# Patient Record
Sex: Female | Born: 1965
Health system: Southern US, Community
[De-identification: ages and names within clinical notes are randomized; demographics above are authoritative.]

## PROBLEM LIST (undated history)

## (undated) DIAGNOSIS — N200 Calculus of kidney: Secondary | ICD-10-CM

## (undated) DIAGNOSIS — G43909 Migraine, unspecified, not intractable, without status migrainosus: Secondary | ICD-10-CM

## (undated) DIAGNOSIS — F329 Major depressive disorder, single episode, unspecified: Secondary | ICD-10-CM

## (undated) DIAGNOSIS — F32A Depression, unspecified: Secondary | ICD-10-CM

## (undated) DIAGNOSIS — I10 Essential (primary) hypertension: Secondary | ICD-10-CM

## (undated) HISTORY — PX: WISDOM TOOTH EXTRACTION: SHX21

## (undated) HISTORY — PX: URETERAL STENT PLACEMENT: SHX822

## (undated) HISTORY — PX: LITHOTRIPSY: SUR834

---

## 2001-01-31 ENCOUNTER — Ambulatory Visit (HOSPITAL_COMMUNITY): Admission: RE | Admit: 2001-01-31 | Discharge: 2001-01-31 | Payer: Self-pay | Admitting: *Deleted

## 2001-01-31 ENCOUNTER — Encounter: Payer: Self-pay | Admitting: Orthopedic Surgery

## 2001-02-07 ENCOUNTER — Encounter: Payer: Self-pay | Admitting: Orthopedic Surgery

## 2001-02-07 ENCOUNTER — Ambulatory Visit (HOSPITAL_COMMUNITY): Admission: RE | Admit: 2001-02-07 | Discharge: 2001-02-07 | Payer: Self-pay | Admitting: Orthopedic Surgery

## 2001-05-31 ENCOUNTER — Other Ambulatory Visit: Admission: RE | Admit: 2001-05-31 | Discharge: 2001-05-31 | Payer: Self-pay | Admitting: Family Medicine

## 2001-08-14 ENCOUNTER — Encounter: Admission: RE | Admit: 2001-08-14 | Discharge: 2001-08-14 | Payer: Self-pay | Admitting: Family Medicine

## 2001-08-14 ENCOUNTER — Encounter: Payer: Self-pay | Admitting: Family Medicine

## 2002-04-24 ENCOUNTER — Encounter: Admission: RE | Admit: 2002-04-24 | Discharge: 2002-04-24 | Payer: Self-pay | Admitting: Family Medicine

## 2002-04-24 ENCOUNTER — Encounter: Payer: Self-pay | Admitting: Family Medicine

## 2003-04-18 ENCOUNTER — Other Ambulatory Visit: Admission: RE | Admit: 2003-04-18 | Discharge: 2003-04-18 | Payer: Self-pay | Admitting: Obstetrics and Gynecology

## 2003-10-13 ENCOUNTER — Emergency Department (HOSPITAL_COMMUNITY): Admission: EM | Admit: 2003-10-13 | Discharge: 2003-10-13 | Payer: Self-pay | Admitting: Emergency Medicine

## 2003-10-16 ENCOUNTER — Encounter: Admission: RE | Admit: 2003-10-16 | Discharge: 2003-10-16 | Payer: Self-pay | Admitting: Family Medicine

## 2003-11-12 ENCOUNTER — Encounter: Admission: RE | Admit: 2003-11-12 | Discharge: 2003-11-12 | Payer: Self-pay | Admitting: Family Medicine

## 2004-05-11 ENCOUNTER — Other Ambulatory Visit: Admission: RE | Admit: 2004-05-11 | Discharge: 2004-05-11 | Payer: Self-pay | Admitting: Obstetrics and Gynecology

## 2005-05-13 ENCOUNTER — Other Ambulatory Visit: Admission: RE | Admit: 2005-05-13 | Discharge: 2005-05-13 | Payer: Self-pay | Admitting: Obstetrics and Gynecology

## 2005-05-23 ENCOUNTER — Ambulatory Visit (HOSPITAL_COMMUNITY): Admission: RE | Admit: 2005-05-23 | Discharge: 2005-05-24 | Payer: Self-pay | Admitting: Urology

## 2005-05-23 ENCOUNTER — Encounter: Payer: Self-pay | Admitting: Emergency Medicine

## 2005-06-10 ENCOUNTER — Ambulatory Visit (HOSPITAL_COMMUNITY): Admission: RE | Admit: 2005-06-10 | Discharge: 2005-06-10 | Payer: Self-pay | Admitting: Urology

## 2006-01-22 IMAGING — CT CT ANGIO CHEST
1 of 4 series · 19 of 30 positions shown · IV contrast (omnipaque)
Comparison: none

CLINICAL DATA: Chest pain, dyspnea.  
 CHEST CT ANGIO WITH CONTRAST
TECHNIQUE: Multidetector CT imaging of the chest was performed according to the protocol for detection of pulmonary embolism during IV bolus injection of 150 ml Omnipaque 300.  Coronal and sagittal plane reformatted images were also generated.

[Series 4: chest/pe 1.0 b10f · axial · 0.74mm/px · z∈[-47,+156]mm · 19 of 448 slices shown]
[im 21/448  lung]
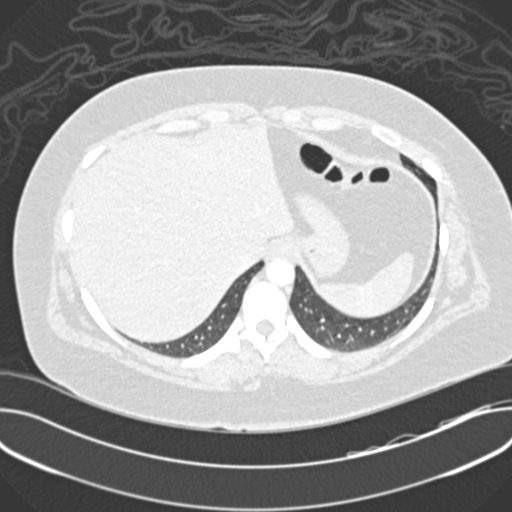
[im 41/448  mediastinal]
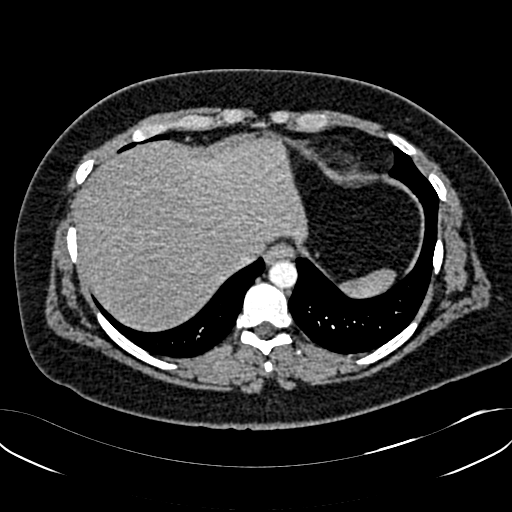
[im 61/448  lung]
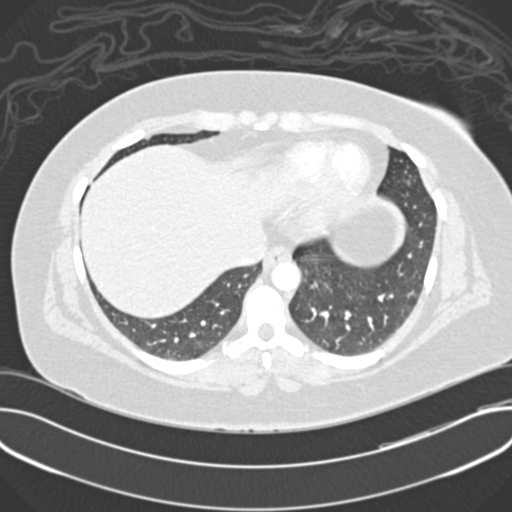
[im 82/448  mediastinal]
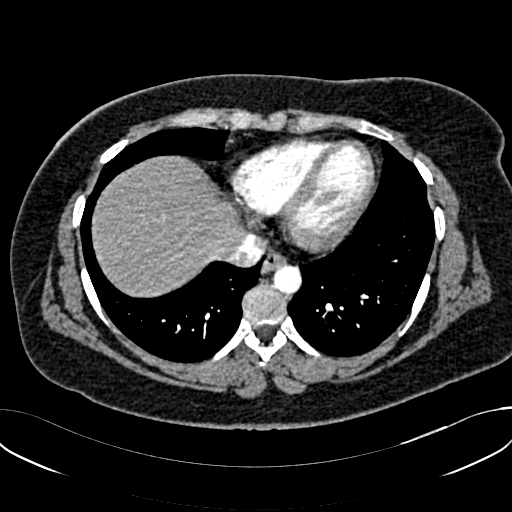
[im 102/448  lung]
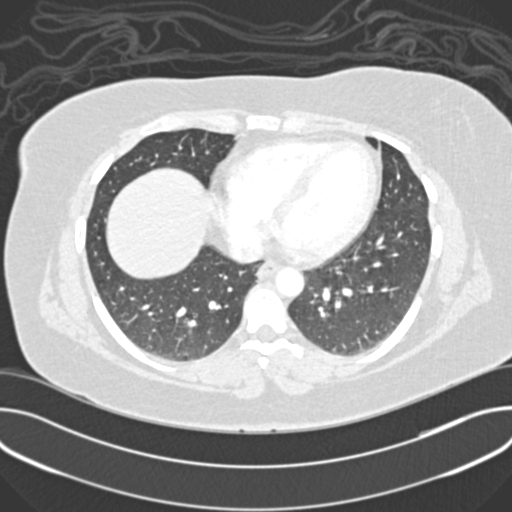
[im 122/448  mediastinal]
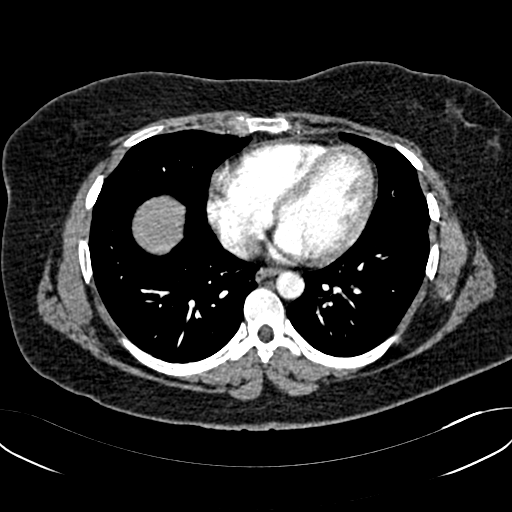
[im 143/448  lung]
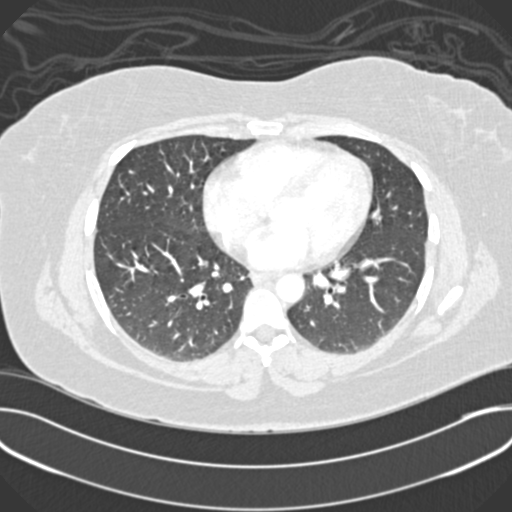
[im 163/448  mediastinal]
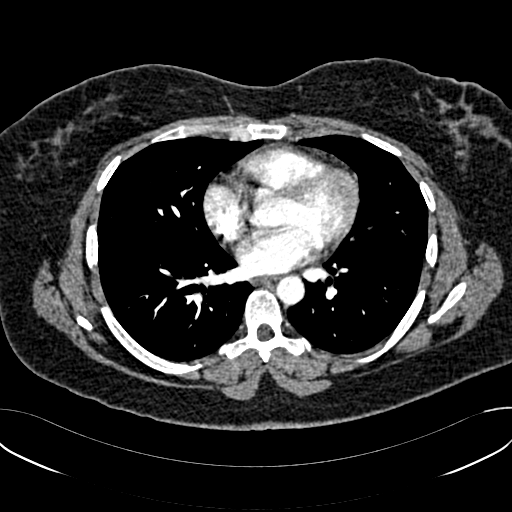
[im 183/448  lung]
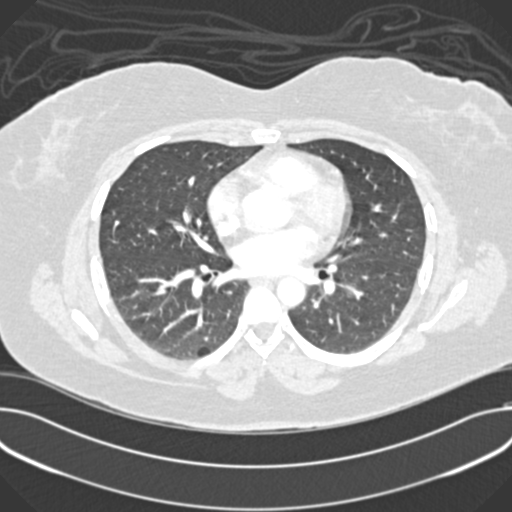
[im 224/448  mediastinal]
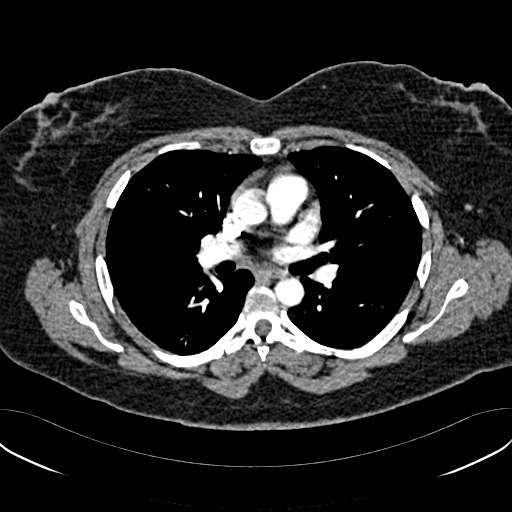
[im 244/448  lung]
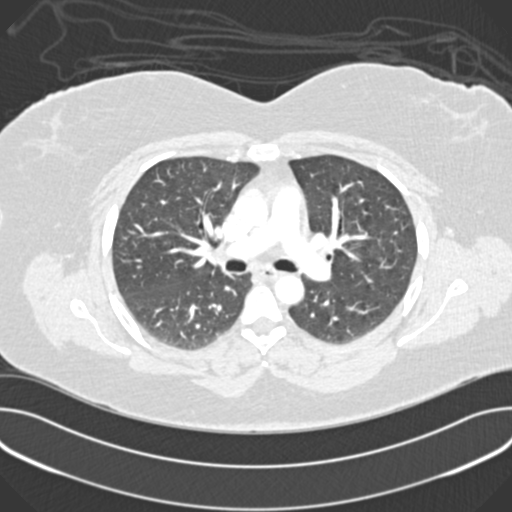
[im 265/448  mediastinal]
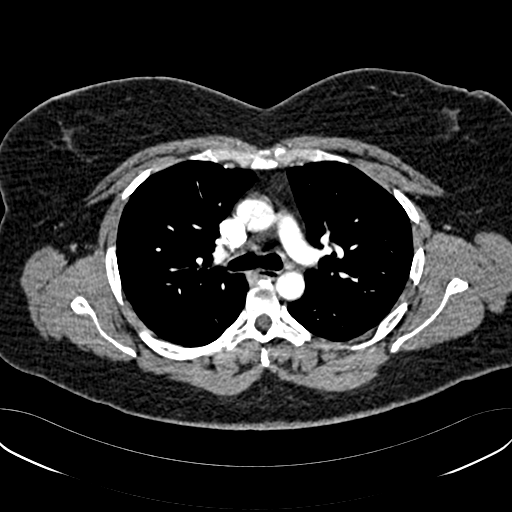
[im 285/448  lung]
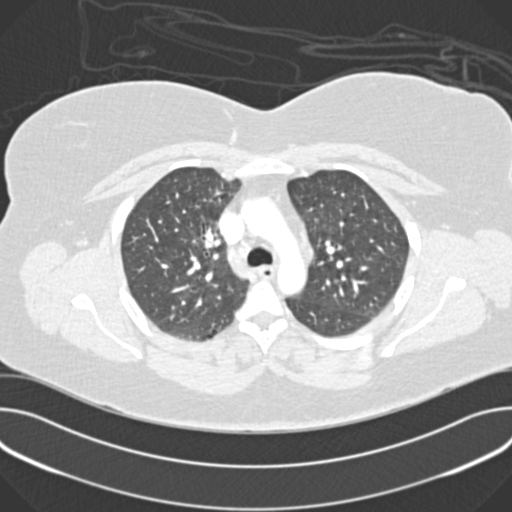
[im 305/448  mediastinal]
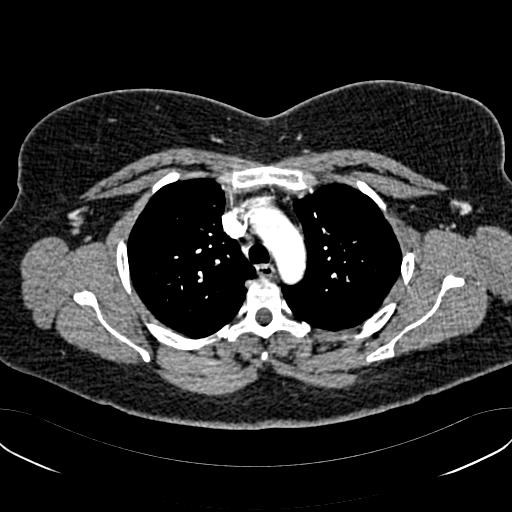
[im 326/448  lung]
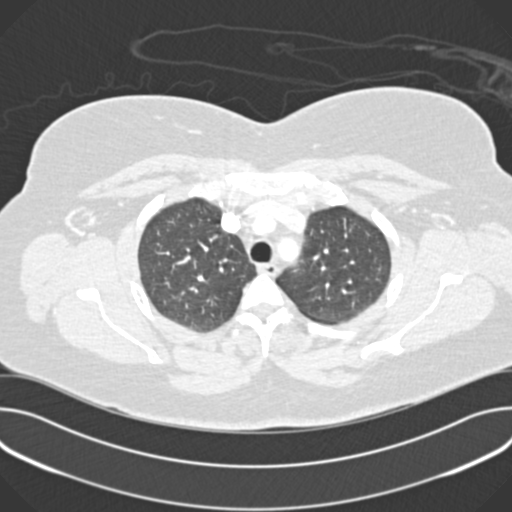
[im 346/448  mediastinal]
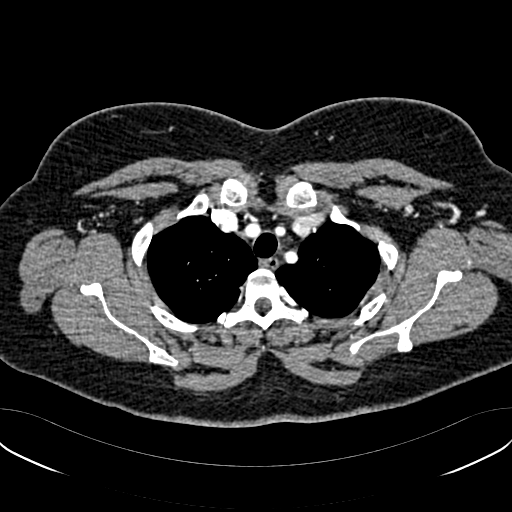
[im 387/448  lung]
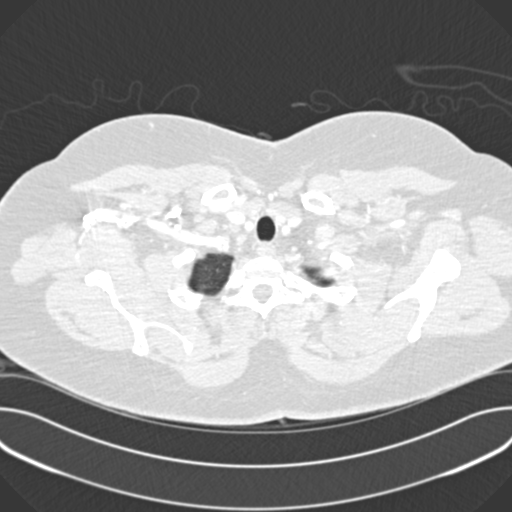
[im 407/448  mediastinal]
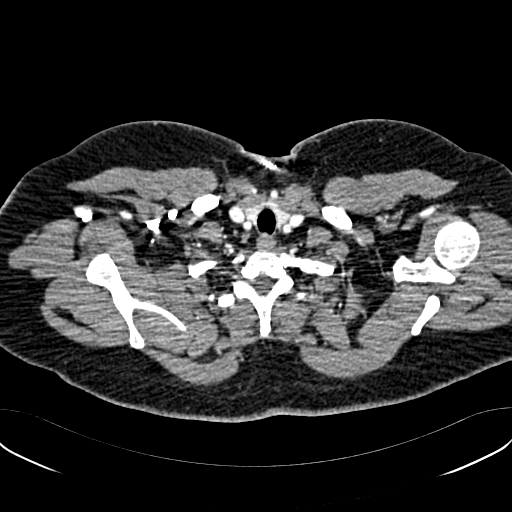
[im 427/448  lung]
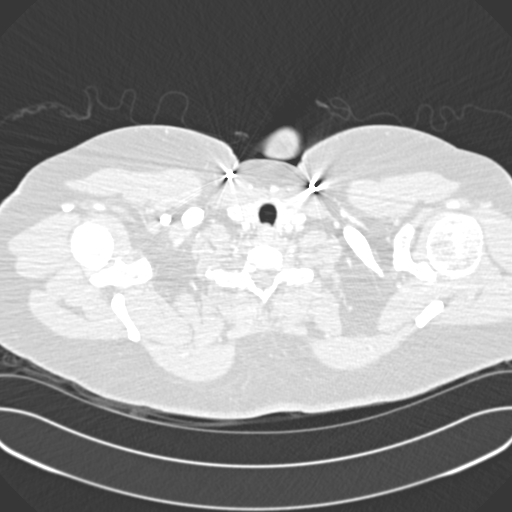

[19 of 30 positions shown; findings below may reference images not displayed]

FINDINGS: No comparison films available. 
 No filling defects in the pulmonary arterial system are noted to suggest pulmonary embolus.  Heart and great vessels are grossly unremarkable.  No pleural or pericardial effusions.  No enlarged lymph nodes identified.  Minimal scar/atelectasis in the left anterior lung base noted.  The remainder of the lungs are clear.  
 IMPRESSION
 No acute abnormality.  Specifically, no evidence of pulmonary embolus.

## 2009-01-28 ENCOUNTER — Ambulatory Visit: Payer: Self-pay | Admitting: Diagnostic Radiology

## 2009-01-28 ENCOUNTER — Ambulatory Visit (HOSPITAL_BASED_OUTPATIENT_CLINIC_OR_DEPARTMENT_OTHER): Admission: RE | Admit: 2009-01-28 | Discharge: 2009-01-28 | Payer: Self-pay | Admitting: Family Medicine

## 2010-04-10 ENCOUNTER — Ambulatory Visit (HOSPITAL_BASED_OUTPATIENT_CLINIC_OR_DEPARTMENT_OTHER)
Admission: RE | Admit: 2010-04-10 | Discharge: 2010-04-10 | Payer: Self-pay | Source: Home / Self Care | Attending: Family Medicine | Admitting: Family Medicine

## 2010-05-17 ENCOUNTER — Encounter: Payer: Self-pay | Admitting: Family Medicine

## 2010-09-11 NOTE — Op Note (Signed)
NAME:  Heather Gomez, Heather Gomez          ACCOUNT NO.:  [id number]   MEDICAL RECORD NO.:  [id number]          PATIENT TYPE:  INP   LOCATION:  1504                         FACILITY:  [facility]   PHYSICIAN:  Courtney Paris, M.D.DATE OF BIRTH:  1965/11/20   DATE OF PROCEDURE:  05/24/2005  DATE OF DISCHARGE:                                 OPERATIVE REPORT   PREOPERATIVE DIAGNOSIS:  Bilateral ureteral obstruction, renal  insufficiency.   POSTOPERATIVE DIAGNOSIS:  Bilateral ureteral obstruction, renal  insufficiency.   OPERATION:  Cystoscopy, bilateral retrograde pyelogram, bilateral ureteral  stent placement.   ANESTHESIA:  General.   SURGEON:  Dr. Aldean Ast   BRIEF HISTORY:  This 45 year old white female was transferred from [facility] where she had had bilateral flank pain for 5 days, right greater  than left, and nausea and vomiting for 3 days.  She was found to have  bilateral renal pelvic stones and is admitted now for cysto stent insertion  since she had some mild renal insufficiency with a creatinine of 2.2.  She  had had previous stones x2 in the past that had passed spontaneously.  These  were 4 and 6 years, respectively.  The stones on CT scan were 5-6 mm each.   The patient was placed on the operating table in dorsal lithotomy position.  After satisfactory induction of general anesthesia, was prepped and draped  with Betadine in the usual sterile fashion.  She was given IV Cipro.  Panendoscope was inserted and the bladder inspected.  It had some diffuse  redness but no bladder tumors.  She had some had normal-appearing orifices  bilaterally.  The right orifice was catheterized with a 5-French open-ended  ureteral catheter, and an occlusive retrograde demonstrated a stone at the  UPJ on the right with normal-appearing ureter and hydronephrosis proximal.  The distal ureter was free of any stones.   The 8.038 floppy-tip guidewire was then passed through the open-ended  ureteral catheter under fluoroscopy at the level of the kidney, and the open-  ended catheter was then removed.  Over the guidewire, a 624 cm stent was  passed on the right and as the guidewire was removed, there was a nice coil  in the renal pelvis and one in bladder.   Another retrograde was performed on the left side.  A 5 open-ended ureteral  catheter was inserted and injected with dye in a retrograde fashion.  Again,  the ureter was small caliber up to the renal pelvic junction where a series  of stones were present causing some hydronephrosis but not as much as it was  on the right side.  A 0.038 floppy-tip guidewire was then passed under  fluoroscopy up to and past the stones into the renal kidney, and the open-  ended catheter was then removed.  Over the guidewire, a 6-French x 26 cm  length stent was then passed over the guidewire with a nice coil in the  renal pelvis and one in the bladder with good decompression of both kidneys.  The bladder  was drained.  B&O suppository inserted.  The patient taken recovery room in  good condition where she will be observed as an outpatient with prolonged  recovery because of her nausea and vomiting.  We will continue to give her  some IV fluids, and she will be n.p.o. for possible lithotripsy later  tomorrow.      Courtney Paris, M.D.  Electronically Signed     HMK/MEDQ  D:  05/24/2005  T:  05/24/2005  Job:  [id number]

## 2010-09-11 NOTE — H&P (Signed)
NAME:  Heather Gomez, Heather Gomez          ACCOUNT NO.:  [id number]   MEDICAL RECORD NO.:  [id number]          PATIENT TYPE:  INP   LOCATION:  1504                         [facility]ACILITY:  [facility]   PHYSICIAN:  Courtney Paris, M.D.DATE OF BIRTH:  05-17-1965   DATE OF ADMISSION:  05/23/2005  DATE OF DISCHARGE:                                HISTORY & PHYSICAL   This 45 year old white female is admitted with bilateral flank pain right  greater than left for 5 days and nausea and vomiting x3 days. She is found  to have bilateral renal pelvic stones at [facility] 5 mm and 6 mm each,  with bilateral hydronephrosis. She had a slight elevation of creatinine of 2  and is admitted now for cystoscopy and ureteral stents bilaterally. She did  have stones twice in the past - one about 4 and one about 6 years ago that  she thinks passed spontaneously. She has had no previous operations. She has  had only a few urinary tract infections, has not run any fever, but had just  been treated for apparent UTI by Dr. Foy Guadalajara 3 days ago with pain medicine.  Then, when the nausea and vomiting persisted she went back to the ER.   MEDICINES:  Include Cardizem, Lamictal, Cymbalta, and Relpax for migraines  p.r.n. She has no real allergies but gets nauseated with TYLOX and  PROPRANOLOL.   REVIEW OF SYSTEMS:  She had been in good general health. She does smoke a  pack of cigarettes per day. No cardiac problems, no GI complaints, no peptic  ulcer disease, no arthritis or joint disease. Her menstrual periods are  irregular; the last was about 3 weeks ago. She does have migraine headaches.   FAMILY HISTORY AND SOCIAL HISTORY:  She is married, works as an Engineer, manufacturing, has four daughters. One brother, 3, and a sister, 8, are in good  health and both parents are also living. Father is 11, mother is 75. There  is a family history of diabetes, of heart disease, and her dad has had  previous kidney stones.   PHYSICAL  EXAMINATION:  VITAL SIGNS:  Temperature is 99.2, pulse 125,  respirations 20, blood pressure 130/74.  GENERAL:  She is a stocky white female in no acute distress, somewhat  flushed but fairly comfortable at the present.  HEENT:  Clear.  NECK:  Supple.  LUNGS:  Do have some rales in them bilaterally.  HEART  SOUNDS:  Distant.  ABDOMEN:  Slight distended with some mild right-greater-than-left CVA  tenderness but no masses.  PELVIC:  Deferred at this time.  EXTREMITIES:  Faint distal pulses, 1+ edema, intact sensation to light  touch.   IMPRESSION:  1.  Bilateral renal calculi, 5 mm and 6 mm each, with obstruction.  2.  Nausea and vomiting secondary to above.  3.  Mild renal insufficiency.   RECOMMENDATION:  Cystoscopy and bilateral ureteral stents and possible  lithotripsy tomorrow as an outpatient procedure.      Courtney Paris, M.D.  Electronically Signed     HMK/MEDQ  D:  05/23/2005  T:  05/24/2005  Job:  [id number] 

## 2011-02-28 ENCOUNTER — Encounter: Payer: Self-pay | Admitting: *Deleted

## 2011-02-28 ENCOUNTER — Emergency Department (HOSPITAL_BASED_OUTPATIENT_CLINIC_OR_DEPARTMENT_OTHER)
Admission: EM | Admit: 2011-02-28 | Discharge: 2011-02-28 | Disposition: A | Discharge status: Home / Self Care | Payer: PRIVATE HEALTH INSURANCE | Attending: Emergency Medicine | Admitting: Emergency Medicine

## 2011-02-28 DIAGNOSIS — F17200 Nicotine dependence, unspecified, uncomplicated: Secondary | ICD-10-CM | POA: Insufficient documentation

## 2011-02-28 DIAGNOSIS — I10 Essential (primary) hypertension: Secondary | ICD-10-CM | POA: Insufficient documentation

## 2011-02-28 DIAGNOSIS — F3289 Other specified depressive episodes: Secondary | ICD-10-CM | POA: Insufficient documentation

## 2011-02-28 DIAGNOSIS — G43909 Migraine, unspecified, not intractable, without status migrainosus: Secondary | ICD-10-CM | POA: Insufficient documentation

## 2011-02-28 DIAGNOSIS — F329 Major depressive disorder, single episode, unspecified: Secondary | ICD-10-CM | POA: Insufficient documentation

## 2011-02-28 HISTORY — DX: Major depressive disorder, single episode, unspecified: F32.9

## 2011-02-28 HISTORY — DX: Migraine, unspecified, not intractable, without status migrainosus: G43.909

## 2011-02-28 HISTORY — DX: Essential (primary) hypertension: I10

## 2011-02-28 HISTORY — DX: Depression, unspecified: F32.A

## 2011-02-28 MED ORDER — HYDROMORPHONE HCL PF 1 MG/ML IJ SOLN
1.0000 mg | Freq: Once | INTRAMUSCULAR | Status: AC
  Administered 2011-02-28: 1 mg via INTRAVENOUS
  Filled 2011-02-28: qty 1

## 2011-02-28 MED ORDER — DIPHENHYDRAMINE HCL 50 MG/ML IJ SOLN
25.0000 mg | Freq: Once | INTRAMUSCULAR | Status: AC
  Administered 2011-02-28: 25 mg via INTRAVENOUS
  Filled 2011-02-28: qty 1

## 2011-02-28 MED ORDER — DEXAMETHASONE SODIUM PHOSPHATE 10 MG/ML IJ SOLN
10.0000 mg | Freq: Once | INTRAMUSCULAR | Status: AC
  Administered 2011-02-28: 10 mg via INTRAVENOUS
  Filled 2011-02-28: qty 1

## 2011-02-28 MED ORDER — METOCLOPRAMIDE HCL 5 MG/ML IJ SOLN
10.0000 mg | Freq: Once | INTRAMUSCULAR | Status: AC
  Administered 2011-02-28: 10 mg via INTRAVENOUS
  Filled 2011-02-28: qty 2

## 2011-02-28 NOTE — ED Notes (Signed)
Pt states she has a hx of migraines and this one started last night. Tried meds without relief. Also vomiting.

## 2011-02-28 NOTE — ED Provider Notes (Signed)
History  Scribed for Heather Cooper III, MD, the patient was seen in MH02/MH02. The chart was scribed by Gilman Schmidt. The patients care was started at 3:09 PM.  CSN: [id number] Arrival date & time: 02/28/2011  1:34 PM   First MD Initiated Contact with Patient 02/28/11 1506      Chief Complaint  Patient presents with  . Migraine     HPI DANEY MOOR is a 45 y.o. female with a history of Migraines and HTN who presents to the Emergency Department complaining of migraine onset last night. Pt reports that entire head hurts and having facial pain. Associated symptoms of vomiting. Pt has tried Ibuprofen and Relpax with no relief. Pt has history of migraines as a teenager that has worsened since 1989. There are no other associated symptoms and no other alleviating or aggravating factors.    Past Medical History  Diagnosis Date  . Migraines   . Hypertension   . Depression     History reviewed. No pertinent past surgical history.  History reviewed. No pertinent family history.  History  Substance Use Topics  . Smoking status: Current Everyday Smoker  . Smokeless tobacco: Not on file  . Alcohol Use: No     Review of Systems  HENT: Negative for neck pain.   Eyes: Negative for photophobia.  Gastrointestinal: Positive for vomiting. Negative for nausea and diarrhea.  Neurological: Positive for headaches. Negative for dizziness, tremors and weakness.  All other systems reviewed and are negative.    Allergies  Tylox Propanolol   Home Medications   Current Outpatient Rx  Name Route Sig Dispense Refill  . CARVEDILOL PO Oral Take 1 tablet by mouth 2 (two) times daily.      . RELPAX PO Oral Take by mouth.      Marland Kitchen HYDROCHLOROTHIAZIDE PO Oral Take 1 tablet by mouth once.      Marland Kitchen LAMICTAL PO Oral Take 2 tablets by mouth 2 (two) times daily.      Marland Kitchen PHENERGAN PO Oral Take by mouth.      . SERTRALINE HCL PO Oral Take 1 tablet by mouth once.        BP 136/93  Pulse 66   Temp(Src) 97.5 F (36.4 C) (Oral)  Resp 20  Ht 5\' 5"  (1.651 m)  Wt 212 lb (96.163 kg)  BMI 35.28 kg/m2  SpO2 100%  Physical Exam  Constitutional: She is oriented to person, place, and time. She appears well-developed and well-nourished.  Non-toxic appearance. She does not have a sickly appearance.  HENT:  Head: Normocephalic and atraumatic.  Eyes: Conjunctivae, EOM and lids are normal. Pupils are equal, round, and reactive to light. No scleral icterus.  Neck: Trachea normal and normal range of motion. Neck supple.  Cardiovascular: Regular rhythm and normal heart sounds.   Pulmonary/Chest: Effort normal and breath sounds normal.  Abdominal: Soft. Normal appearance. There is no tenderness. There is no rebound, no guarding and no CVA tenderness.  Musculoskeletal: Normal range of motion.       No cranial tenderness with palpation   Neurological: She is alert and oriented to person, place, and time. She has normal strength and normal reflexes.  Skin: Skin is warm, dry and intact. No rash noted.    ED Course  Procedures  DIAGNOSTIC STUDIES: Oxygen Saturation is 100% on room air, normal by my interpretation.    COORDINATION OF CARE: 3:09PM:  - Patient evaluated by ED physician, Reglan, Decadron, and Benadryl ordered  5:11 PM Pt's  headache is improved.  Ordered Dilaudid 1 mg iv now, and will release after that.    I personally performed the services described in this documentation, which was scribed in my presence. The recorded information has been reviewed and considered. Osvaldo Human, M.D.        Heather Cooper III, MD 02/28/11 [phone]

## 2011-02-28 NOTE — ED Notes (Signed)
Patient stated that daughter will come pick her up and requested something additional for pain, states that she felt a lot better but still hurt especially when she tried to move

## 2011-10-03 ENCOUNTER — Emergency Department (HOSPITAL_BASED_OUTPATIENT_CLINIC_OR_DEPARTMENT_OTHER): Payer: PRIVATE HEALTH INSURANCE

## 2011-10-03 ENCOUNTER — Encounter (HOSPITAL_BASED_OUTPATIENT_CLINIC_OR_DEPARTMENT_OTHER): Payer: Self-pay | Admitting: *Deleted

## 2011-10-03 ENCOUNTER — Emergency Department (HOSPITAL_BASED_OUTPATIENT_CLINIC_OR_DEPARTMENT_OTHER)
Admission: EM | Admit: 2011-10-03 | Discharge: 2011-10-03 | Disposition: A | Discharge status: Home / Self Care | Payer: PRIVATE HEALTH INSURANCE | Attending: Emergency Medicine | Admitting: Emergency Medicine

## 2011-10-03 DIAGNOSIS — Z9889 Other specified postprocedural states: Secondary | ICD-10-CM | POA: Insufficient documentation

## 2011-10-03 DIAGNOSIS — R300 Dysuria: Secondary | ICD-10-CM | POA: Insufficient documentation

## 2011-10-03 DIAGNOSIS — K7689 Other specified diseases of liver: Secondary | ICD-10-CM | POA: Insufficient documentation

## 2011-10-03 DIAGNOSIS — K5289 Other specified noninfective gastroenteritis and colitis: Secondary | ICD-10-CM | POA: Insufficient documentation

## 2011-10-03 DIAGNOSIS — R10813 Right lower quadrant abdominal tenderness: Secondary | ICD-10-CM | POA: Insufficient documentation

## 2011-10-03 DIAGNOSIS — Z87442 Personal history of urinary calculi: Secondary | ICD-10-CM | POA: Insufficient documentation

## 2011-10-03 DIAGNOSIS — R109 Unspecified abdominal pain: Secondary | ICD-10-CM | POA: Insufficient documentation

## 2011-10-03 DIAGNOSIS — K529 Noninfective gastroenteritis and colitis, unspecified: Secondary | ICD-10-CM

## 2011-10-03 HISTORY — DX: Calculus of kidney: N20.0

## 2011-10-03 LAB — DIFFERENTIAL
Eosinophils Absolute: 0.1 10*3/uL (ref 0.0–0.7)
Eosinophils Relative: 1 % (ref 0–5)
Lymphocytes Relative: 20 % (ref 12–46)
Lymphs Abs: 1.9 10*3/uL (ref 0.7–4.0)
Monocytes Absolute: 0.7 10*3/uL (ref 0.1–1.0)
Neutrophils Relative %: 72 % (ref 43–77)

## 2011-10-03 LAB — URINALYSIS, ROUTINE W REFLEX MICROSCOPIC
Leukocytes, UA: NEGATIVE
Nitrite: NEGATIVE
Specific Gravity, Urine: 1.018 (ref 1.005–1.030)
Urobilinogen, UA: 0.2 mg/dL (ref 0.0–1.0)
pH: 6.5 (ref 5.0–8.0)

## 2011-10-03 LAB — COMPREHENSIVE METABOLIC PANEL
Albumin: 4.2 g/dL (ref 3.5–5.2)
Calcium: 9.7 mg/dL (ref 8.4–10.5)
Chloride: 104 mEq/L (ref 96–112)
Creatinine, Ser: 0.9 mg/dL (ref 0.50–1.10)
GFR calc Af Amer: 88 mL/min — ABNORMAL LOW (ref >90)
GFR calc non Af Amer: 76 mL/min — ABNORMAL LOW (ref >90)
Glucose, Bld: 125 mg/dL — ABNORMAL HIGH (ref 70–99)
Potassium: 3.7 mEq/L (ref 3.5–5.1)
Total Bilirubin: 0.3 mg/dL (ref 0.3–1.2)

## 2011-10-03 LAB — CBC
MCH: 34.5 pg — ABNORMAL HIGH (ref 26.0–34.0)
MCHC: 36.3 g/dL — ABNORMAL HIGH (ref 30.0–36.0)

## 2011-10-03 LAB — LIPASE, BLOOD: Lipase: 42 U/L (ref 11–59)

## 2011-10-03 LAB — PREGNANCY, URINE: Preg Test, Ur: NEGATIVE

## 2011-10-03 MED ORDER — METRONIDAZOLE 500 MG PO TABS: 500.0000 mg | ORAL_TABLET | Freq: Three times a day (TID) | ORAL | Status: AC

## 2011-10-03 MED ORDER — IOHEXOL 300 MG/ML  SOLN
20.0000 mL | INTRAMUSCULAR | Status: AC
  Administered 2011-10-03 (×2): 20 mL via ORAL

## 2011-10-03 MED ORDER — ONDANSETRON HCL 4 MG/2ML IJ SOLN: 4.0000 mg | Freq: Once | INTRAMUSCULAR | Status: DC

## 2011-10-03 MED ORDER — SODIUM CHLORIDE 0.9 % IV BOLUS (SEPSIS)
1000.0000 mL | Freq: Once | INTRAVENOUS | Status: AC
  Administered 2011-10-03 (×2): 1000 mL via INTRAVENOUS

## 2011-10-03 MED ORDER — ONDANSETRON HCL 4 MG/2ML IJ SOLN
4.0000 mg | Freq: Once | INTRAMUSCULAR | Status: AC
  Administered 2011-10-03: 4 mg via INTRAVENOUS
  Filled 2011-10-03: qty 2

## 2011-10-03 MED ORDER — SODIUM CHLORIDE 0.9 % IV BOLUS (SEPSIS): 1000.0000 mL | Freq: Once | INTRAVENOUS | Status: DC

## 2011-10-03 MED ORDER — MORPHINE SULFATE 4 MG/ML IJ SOLN
4.0000 mg | Freq: Once | INTRAMUSCULAR | Status: AC
  Administered 2011-10-03: 4 mg via INTRAVENOUS
  Filled 2011-10-03: qty 1

## 2011-10-03 MED ORDER — CIPROFLOXACIN HCL 500 MG PO TABS: 500.0000 mg | ORAL_TABLET | Freq: Two times a day (BID) | ORAL | Status: AC

## 2011-10-03 MED ORDER — HYDROCODONE-ACETAMINOPHEN 5-500 MG PO TABS: 1.0000 | ORAL_TABLET | Freq: Four times a day (QID) | ORAL | Status: AC | PRN

## 2011-10-03 MED ORDER — MORPHINE SULFATE 4 MG/ML IJ SOLN: 4.0000 mg | Freq: Once | INTRAMUSCULAR | Status: DC

## 2011-10-03 MED ORDER — IOHEXOL 300 MG/ML  SOLN
100.0000 mL | Freq: Once | INTRAMUSCULAR | Status: AC | PRN
  Administered 2011-10-03: 100 mL via INTRAVENOUS

## 2011-10-03 NOTE — ED Notes (Signed)
Pt describes low abd and right flank pain onset last p.m. With dysuria and urgency. Also felt constipated and took a dulcolax. "Squirts of bright red bld this a.m."

## 2011-10-03 NOTE — ED Provider Notes (Signed)
History     CSN: [id number]  Arrival date & time 10/03/11  1326   First MD Initiated Contact with Patient 10/03/11 1345      Chief Complaint  Patient presents with  . Abdominal Pain    (Consider location/radiation/quality/duration/timing/severity/associated sxs/prior treatment) HPI Patient is a 71 roll female who presents today complaining of right lower quadrant abdominal pain radiating around to her back as well as to her left side she rates as a 7/10. This is sharp. Patient states that it feels somewhat similar to prior kidney stones but not exactly. She denies any dysuria or hematuria. Patient endorses nausea, vomiting, and diarrhea. She also notes that she has seen some blood in her stools but notes that this was only blood on the stool itself and that the majority of the water in the toilet was actually clear. Patient does have history of hemorrhoids in no history of GI bleeding. She denies any vaginal discharge. Patient denies any fevers or sick contacts. She has no history of abdominal surgeries. There are no other associated or modifying factors to Past Medical History  Diagnosis Date  . Migraines   . Hypertension   . Depression   . Kidney stones     Past Surgical History  Procedure Date  . Ureteral stent placement   . Lithotripsy     History reviewed. No pertinent family history.  History  Substance Use Topics  . Smoking status: Current Everyday Smoker  . Smokeless tobacco: Not on file  . Alcohol Use: No    OB History    Grav Para Term Preterm Abortions TAB SAB Ect Mult Living                  Review of Systems  Constitutional: Negative.   HENT: Negative.   Eyes: Negative.   Respiratory: Negative.   Cardiovascular: Negative.   Gastrointestinal: Positive for nausea, vomiting, abdominal pain, diarrhea and blood in stool.  Genitourinary: Negative.   Musculoskeletal: Negative.   Skin: Negative.   Neurological: Negative.   Hematological: Negative.     Psychiatric/Behavioral: Negative.   All other systems reviewed and are negative.    Allergies  Labetalol  Home Medications   Current Outpatient Rx  Name Route Sig Dispense Refill  . CARVEDILOL 12.5 MG PO TABS Oral Take 12.5 mg by mouth 2 (two) times daily with a meal.    . CYCLOBENZAPRINE HCL 10 MG PO TABS Oral Take 10 mg by mouth 3 (three) times daily as needed.    Marland Kitchen ELETRIPTAN HYDROBROMIDE 40 MG PO TABS Oral One tablet by mouth at onset of headache. May repeat in 2 hours if headache persists or recurs. may repeat in 2 hours if necessary    . HYDROCHLOROTHIAZIDE 25 MG PO TABS Oral Take 25 mg by mouth daily.    Marland Kitchen LAMOTRIGINE 100 MG PO TABS Oral Take 100 mg by mouth 2 (two) times daily.    Marland Kitchen PROMETHAZINE HCL 25 MG PO TABS Oral Take 25 mg by mouth every 6 (six) hours as needed.    . SERTRALINE HCL 100 MG PO TABS Oral Take 100 mg by mouth daily.      BP 140/107  Pulse 80  Temp(Src) 97.7 F (36.5 C) (Oral)  Resp 20  Ht 5\' 5"  (1.651 m)  Wt 205 lb (92.987 kg)  BMI 34.11 kg/m2  SpO2 100%  Physical Exam  Nursing note and vitals reviewed. GEN: Well-developed, well-nourished female in no distress HEENT: Atraumatic, normocephalic. Oropharynx clear without  erythema EYES: PERRLA BL, no scleral icterus. NECK: Trachea midline, no meningismus CV: regular rate and rhythm. No murmurs, rubs, or gallops PULM: No respiratory distress.  No crackles, wheezes, or rales. GI: soft, RLQ TTP, mild. No guarding, rebound. + bowel sounds  GU: deferred Neuro: cranial nerves 2-12 intact, no abnormalities of strength or sensation, A and O x 3 MSK: Patient moves all 4 extremities symmetrically, no deformity, edema, or injury noted Skin: No rashes petechiae, purpura, or jaundice Psych: no abnormality of mood   ED Course  Procedures (including critical care time)  Labs Reviewed  CBC - Abnormal; Notable for the following:    Hemoglobin 16.2 (*)    MCH 34.5 (*)    MCHC 36.3 (*)    All other  components within normal limits  COMPREHENSIVE METABOLIC PANEL - Abnormal; Notable for the following:    Glucose, Bld 125 (*)    GFR calc non Af Amer 76 (*)    GFR calc Af Amer 88 (*)    All other components within normal limits  URINALYSIS, ROUTINE W REFLEX MICROSCOPIC  PREGNANCY, URINE  DIFFERENTIAL  LIPASE, BLOOD   No results found.   No diagnosis found.    MDM  Patient was evaluated by myself. Based on evaluation patient had laboratory workup for her abdominal pain performed. She did not have an acute abdomen. Labs were unremarkable with no anemia or leukocytosis. Renal function was preserved in liver panel and lipase were within normal limits. Urine was pristine. Patient had CT of abdomen and pelvis ordered. She was given IV fluid as well as morphine and Zofran. Patient continued to have pain and a second liter of fluid was ordered as well as CT abdomen pelvis. This is pending at this time. Care signed out to oncoming attending for further management        Cyndra Numbers, MD 10/03/11 1641

## 2011-10-03 NOTE — Discharge Instructions (Signed)
Colitis Colitis is inflammation of the colon. Colitis can be a short-term or long-standing (chronic) illness. Crohn's disease and ulcerative colitis are 2 types of colitis which are chronic. They usually require lifelong treatment. CAUSES  There are many different causes of colitis, including:  Viruses.   Germs (bacteria).   Medicine reactions.  SYMPTOMS   Diarrhea.   Intestinal bleeding.   Pain.   Fever.   Throwing up (vomiting).   Tiredness (fatigue).   Weight loss.   Bowel blockage.  DIAGNOSIS  The diagnosis of colitis is based on examination and stool or blood tests. X-rays, CT scan, and colonoscopy may also be needed. TREATMENT  Treatment may include:  Fluids given through the vein (intravenously).   Bowel rest (nothing to eat or drink for a period of time).   Medicine for pain and diarrhea.   Medicines (antibiotics) that kill germs.   Cortisone medicines.   Surgery.  HOME CARE INSTRUCTIONS   Get plenty of rest.   Drink enough water and fluids to keep your urine clear or pale yellow.   Eat a well-balanced diet.   Call your caregiver for follow-up as recommended.  SEEK IMMEDIATE MEDICAL CARE IF:   You develop chills.   You have an oral temperature above 102 F (38.9 C), not controlled by medicine.   You have extreme weakness, fainting, or dehydration.   You have repeated vomiting.   You develop severe belly (abdominal) pain or are passing bloody or tarry stools.  MAKE SURE YOU:   Understand these instructions.   Will watch your condition.   Will get help right away if you are not doing well or get worse.  Document Released: 05/20/2004 Document Revised: 04/01/2011 Document Reviewed: 08/15/2009 [employer] Patient Information 2012 [employer]. 

## 2011-10-03 NOTE — ED Provider Notes (Signed)
Care assumed from Dr. Alto Denver at shift change.  I agree with her note, assessment, and plan.  The CT shows colitis, favoring an infectious etiology.  She is feeling better with meds given.  She will be discharged with cipro, flagyl, and lortab for pain.  To return if worsens.  Geoffery Lyons, MD 10/03/11 [phone]

## 2011-10-05 ENCOUNTER — Encounter (HOSPITAL_BASED_OUTPATIENT_CLINIC_OR_DEPARTMENT_OTHER): Payer: Self-pay | Admitting: *Deleted

## 2011-10-05 ENCOUNTER — Emergency Department (HOSPITAL_BASED_OUTPATIENT_CLINIC_OR_DEPARTMENT_OTHER)
Admission: EM | Admit: 2011-10-05 | Discharge: 2011-10-05 | Disposition: A | Discharge status: Home / Self Care | Payer: PRIVATE HEALTH INSURANCE | Attending: Emergency Medicine | Admitting: Emergency Medicine

## 2011-10-05 DIAGNOSIS — K5289 Other specified noninfective gastroenteritis and colitis: Secondary | ICD-10-CM | POA: Insufficient documentation

## 2011-10-05 DIAGNOSIS — E876 Hypokalemia: Secondary | ICD-10-CM | POA: Insufficient documentation

## 2011-10-05 DIAGNOSIS — K7689 Other specified diseases of liver: Secondary | ICD-10-CM | POA: Insufficient documentation

## 2011-10-05 DIAGNOSIS — F329 Major depressive disorder, single episode, unspecified: Secondary | ICD-10-CM | POA: Insufficient documentation

## 2011-10-05 DIAGNOSIS — Z87442 Personal history of urinary calculi: Secondary | ICD-10-CM | POA: Insufficient documentation

## 2011-10-05 DIAGNOSIS — K529 Noninfective gastroenteritis and colitis, unspecified: Secondary | ICD-10-CM

## 2011-10-05 DIAGNOSIS — I10 Essential (primary) hypertension: Secondary | ICD-10-CM | POA: Insufficient documentation

## 2011-10-05 DIAGNOSIS — F17200 Nicotine dependence, unspecified, uncomplicated: Secondary | ICD-10-CM | POA: Insufficient documentation

## 2011-10-05 DIAGNOSIS — F3289 Other specified depressive episodes: Secondary | ICD-10-CM | POA: Insufficient documentation

## 2011-10-05 LAB — COMPREHENSIVE METABOLIC PANEL
ALT: 13 U/L (ref 0–35)
Alkaline Phosphatase: 70 U/L (ref 39–117)
BUN: 9 mg/dL (ref 6–23)
Chloride: 102 mEq/L (ref 96–112)
GFR calc Af Amer: 88 mL/min — ABNORMAL LOW (ref >90)
Glucose, Bld: 129 mg/dL — ABNORMAL HIGH (ref 70–99)
Potassium: 3.3 mEq/L — ABNORMAL LOW (ref 3.5–5.1)
Sodium: 139 mEq/L (ref 135–145)
Total Bilirubin: 0.4 mg/dL (ref 0.3–1.2)
Total Protein: 6.7 g/dL (ref 6.0–8.3)

## 2011-10-05 LAB — DIFFERENTIAL
Eosinophils Absolute: 0.2 10*3/uL (ref 0.0–0.7)
Lymphocytes Relative: 22 % (ref 12–46)
Lymphs Abs: 1.9 10*3/uL (ref 0.7–4.0)
Monocytes Relative: 5 % (ref 3–12)
Neutro Abs: 6.2 10*3/uL (ref 1.7–7.7)
Neutrophils Relative %: 71 % (ref 43–77)

## 2011-10-05 LAB — CBC
Hemoglobin: 14.8 g/dL (ref 12.0–15.0)
Platelets: 172 10*3/uL (ref 150–400)
RBC: 4.32 MIL/uL (ref 3.87–5.11)
WBC: 8.8 10*3/uL (ref 4.0–10.5)

## 2011-10-05 MED ORDER — POTASSIUM CHLORIDE CRYS ER 20 MEQ PO TBCR
40.0000 meq | EXTENDED_RELEASE_TABLET | Freq: Once | ORAL | Status: AC
  Administered 2011-10-05: 40 meq via ORAL
  Filled 2011-10-05: qty 2

## 2011-10-05 MED ORDER — CIPROFLOXACIN IN D5W 400 MG/200ML IV SOLN
500.0000 mg | Freq: Once | INTRAVENOUS | Status: AC
  Administered 2011-10-05: 400 mg via INTRAVENOUS
  Filled 2011-10-05: qty 200

## 2011-10-05 MED ORDER — HYDROMORPHONE HCL PF 1 MG/ML IJ SOLN
1.0000 mg | Freq: Once | INTRAMUSCULAR | Status: AC
  Administered 2011-10-05: 1 mg via INTRAVENOUS
  Filled 2011-10-05: qty 1

## 2011-10-05 MED ORDER — SODIUM CHLORIDE 0.9 % IV BOLUS (SEPSIS)
1000.0000 mL | Freq: Once | INTRAVENOUS | Status: AC
  Administered 2011-10-05: 1000 mL via INTRAVENOUS

## 2011-10-05 MED ORDER — METRONIDAZOLE IN NACL 5-0.79 MG/ML-% IV SOLN
500.0000 mg | Freq: Once | INTRAVENOUS | Status: AC
  Administered 2011-10-05: 500 mg via INTRAVENOUS
  Filled 2011-10-05: qty 100

## 2011-10-05 MED ORDER — ONDANSETRON HCL 4 MG/2ML IJ SOLN
4.0000 mg | Freq: Once | INTRAMUSCULAR | Status: AC
  Administered 2011-10-05: 4 mg via INTRAVENOUS
  Filled 2011-10-05: qty 2

## 2011-10-05 NOTE — ED Provider Notes (Signed)
History     CSN: [id number]  Arrival date & time 10/05/11  1055   First MD Initiated Contact with Patient 10/05/11 1108      Chief Complaint  Patient presents with  . Abdominal Pain     HPI The patient presents with concerns of ongoing abdominal pain, bloody stool, generalized discomfort.  She was seen here 2 days ago after several days of abdominal pain.  During that presentation she was evaluated with labs, CT.  She was diagnosed with colitis, and discharged with antibiotics.  She notes that upon going home she was initially intolerant of by mouth, including her medication.  Yesterday she was able to tolerate all her medication as directed.  Today she has not taken any medication for she she was coming into the hospital.  Her primary concern is the persistency of her pain.  The patient is a scribe as left-sided, crampy, persistent, not improved with medication.  Her secondary concern is the persistency of rectal bleeding.  She notes that with all bowel movements there is some blood mixed in with her stool.  She denies any ongoing lightheadedness, vomiting, new chest pain or dyspnea. Past Medical History  Diagnosis Date  . Migraines   . Hypertension   . Depression   . Kidney stones     Past Surgical History  Procedure Date  . Ureteral stent placement   . Lithotripsy     No family history on file.  History  Substance Use Topics  . Smoking status: Current Everyday Smoker  . Smokeless tobacco: Not on file  . Alcohol Use: No    OB History    Grav Para Term Preterm Abortions TAB SAB Ect Mult Living                  Review of Systems  Constitutional:       HPI  HENT:       HPI otherwise negative  Eyes: Negative.   Respiratory:       HPI, otherwise negative  Cardiovascular:       HPI, otherwise nmegative  Gastrointestinal: Negative for vomiting.  Genitourinary:       HPI, otherwise negative  Musculoskeletal:       HPI, otherwise negative  Skin: Negative.     Neurological: Negative for syncope.    Allergies  Labetalol  Home Medications   Current Outpatient Rx  Name Route Sig Dispense Refill  . CARVEDILOL 12.5 MG PO TABS Oral Take 12.5 mg by mouth 2 (two) times daily with a meal.    . CIPROFLOXACIN HCL 500 MG PO TABS Oral Take 1 tablet (500 mg total) by mouth 2 (two) times daily. One po bid x 7 days 14 tablet 0  . CYCLOBENZAPRINE HCL 10 MG PO TABS Oral Take 10 mg by mouth 3 (three) times daily as needed.    Marland Kitchen ELETRIPTAN HYDROBROMIDE 40 MG PO TABS Oral One tablet by mouth at onset of headache. May repeat in 2 hours if headache persists or recurs. may repeat in 2 hours if necessary    . HYDROCHLOROTHIAZIDE 25 MG PO TABS Oral Take 25 mg by mouth daily.    Marland Kitchen HYDROCODONE-ACETAMINOPHEN 5-500 MG PO TABS Oral Take 1-2 tablets by mouth every 6 (six) hours as needed for pain. 20 tablet 0  . LAMOTRIGINE 100 MG PO TABS Oral Take 100 mg by mouth 2 (two) times daily.    Marland Kitchen METRONIDAZOLE 500 MG PO TABS Oral Take 1 tablet (500 mg total)  by mouth 3 (three) times daily. One po bid x 7 days 21 tablet 0  . PROMETHAZINE HCL 25 MG PO TABS Oral Take 25 mg by mouth every 6 (six) hours as needed.    . SERTRALINE HCL 100 MG PO TABS Oral Take 100 mg by mouth daily.      BP 130/100  Pulse 96  Temp(Src) 98.6 F (37 C) (Oral)  Resp 18  SpO2 98%  Physical Exam  Nursing note and vitals reviewed. Constitutional: She is oriented to person, place, and time. She appears well-developed and well-nourished. No distress.  HENT:  Head: Normocephalic and atraumatic.  Eyes: Conjunctivae and EOM are normal.  Cardiovascular: Normal rate and regular rhythm.   Pulmonary/Chest: Effort normal and breath sounds normal. No stridor. No respiratory distress.  Abdominal: Soft. She exhibits no distension. There is tenderness in the left upper quadrant and left lower quadrant. There is no rigidity, no rebound, no guarding and no CVA tenderness.  Musculoskeletal: She exhibits no edema.   Neurological: She is alert and oriented to person, place, and time. No cranial nerve deficit.  Skin: Skin is warm and dry.  Psychiatric: She has a normal mood and affect.    ED Course  Procedures (including critical care time)  Labs Reviewed  CBC - Abnormal; Notable for the following:    MCH 34.3 (*)    All other components within normal limits  COMPREHENSIVE METABOLIC PANEL - Abnormal; Notable for the following:    Potassium 3.3 (*)    Glucose, Bld 129 (*)    GFR calc non Af Amer 76 (*)    GFR calc Af Amer 88 (*)    All other components within normal limits  DIFFERENTIAL   Ct Abdomen Pelvis W Contrast  10/03/2011  *RADIOLOGY REPORT*  Clinical Data: Lower abdominal pain and right flank pain since last night.  Dysuria and frequency.  Status post lithotripsy and ureteric stent.  History of renal stones.  CT ABDOMEN AND PELVIS WITH CONTRAST  Technique:  Multidetector CT imaging of the abdomen and pelvis was performed following the standard protocol during bolus administration of intravenous contrast.  Contrast: OMNIPAQUE IOHEXOL 300 MG/ML  SOLN 100  ml Omnipaque- 300  Comparison: 04/10/2010  Findings: Clear lung bases.  Normal heart size without pericardial or pleural effusion.  Mild to moderate hepatic steatosis.  Normal spleen, stomach, pancreas, gallbladder, biliary tract, adrenal glands.  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 Interpolar left renal cysts.  Too small to characterize upper pole right renal lesion.  Aortic atherosclerosis. No retroperitoneal or retrocrural adenopathy.  Wall thickening of and mild edema surrounding the descending colon. Example image 46. Normal terminal ileum and appendix.  Normal small bowel without abdominal ascites.    No pelvic adenopathy.  Normal urinary bladder and uterus.  No adnexal mass or significant free fluid.  IMPRESSION:  1.  Left-sided colitis.  Favor infection.  Exclude C difficile clinically.  Distribution makes  inflammatory bowel disease less likely. 2.  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 3.  Hepatic steatosis.  Original Report Authenticated By: Consuello Bossier, M.D.     No diagnosis found.   1:45 PM Patient states that she is feeling better.  2:45 PM Patient continues to feel better.  She does not need medications for pain, no nausea MDM  This generally well-appearing female presents several days after initially being diagnosed with colitis, now with persistent abdominal discomfort.  On exam the patient is in no distress with  unremarkable vital signs.  The patient's description of persistent nausea and discomfort is consistent with resolving colitis.  Her by mouth tolerance is reassuring.  The patient's labs are largely reassuring as well, with the notation of mild hypokalemia.  Hypokalemia was treated with oral supplements, and the patient had her antibiotics provided via IV today.  She noted a significant improvement in her condition.  I spent a considerable amount of time discussing the natural course of colitis, any other possible etiologies, including autoimmune or inflammatory with the patient.  I stressed the need for ongoing care with her primary care physician and a gastroenterologist for anticipated colonoscopy.  She was discharged in stable condition.  Gerhard Munch, MD 10/05/11 1445

## 2011-10-05 NOTE — ED Notes (Signed)
Was seen here on Sunday for abdominal pain and diagnosed with colitis. Here today with continued abdominal pain, headache and nausea. States she feels weak and dehydrated. Rectal bleeding with dark colored blood.

## 2011-10-05 NOTE — Discharge Instructions (Signed)
As we discussed, it is important that you continue to have your condition managed by your physician with referral to a gastroenterologist.  If you develop any new or concerning changes in your condition, please return to the emergency department immediately for additional evaluation

## 2013-12-19 ENCOUNTER — Emergency Department (HOSPITAL_BASED_OUTPATIENT_CLINIC_OR_DEPARTMENT_OTHER)
Admission: EM | Admit: 2013-12-19 | Discharge: 2013-12-19 | Disposition: A | Discharge status: Home / Self Care | Payer: BC Managed Care – PPO | Attending: Emergency Medicine | Admitting: Emergency Medicine

## 2013-12-19 ENCOUNTER — Encounter (HOSPITAL_BASED_OUTPATIENT_CLINIC_OR_DEPARTMENT_OTHER): Payer: Self-pay | Admitting: Emergency Medicine

## 2013-12-19 ENCOUNTER — Emergency Department (HOSPITAL_BASED_OUTPATIENT_CLINIC_OR_DEPARTMENT_OTHER): Payer: BC Managed Care – PPO

## 2013-12-19 DIAGNOSIS — I10 Essential (primary) hypertension: Secondary | ICD-10-CM | POA: Diagnosis not present

## 2013-12-19 DIAGNOSIS — R109 Unspecified abdominal pain: Secondary | ICD-10-CM | POA: Diagnosis not present

## 2013-12-19 DIAGNOSIS — Z87442 Personal history of urinary calculi: Secondary | ICD-10-CM | POA: Diagnosis not present

## 2013-12-19 DIAGNOSIS — G43001 Migraine without aura, not intractable, with status migrainosus: Secondary | ICD-10-CM | POA: Insufficient documentation

## 2013-12-19 DIAGNOSIS — G43909 Migraine, unspecified, not intractable, without status migrainosus: Secondary | ICD-10-CM | POA: Insufficient documentation

## 2013-12-19 DIAGNOSIS — Z79899 Other long term (current) drug therapy: Secondary | ICD-10-CM | POA: Diagnosis not present

## 2013-12-19 DIAGNOSIS — F17200 Nicotine dependence, unspecified, uncomplicated: Secondary | ICD-10-CM | POA: Insufficient documentation

## 2013-12-19 DIAGNOSIS — Z9889 Other specified postprocedural states: Secondary | ICD-10-CM | POA: Diagnosis not present

## 2013-12-19 DIAGNOSIS — F329 Major depressive disorder, single episode, unspecified: Secondary | ICD-10-CM | POA: Diagnosis not present

## 2013-12-19 DIAGNOSIS — F3289 Other specified depressive episodes: Secondary | ICD-10-CM | POA: Insufficient documentation

## 2013-12-19 LAB — CBC WITH DIFFERENTIAL/PLATELET
BASOS PCT: 1 % (ref 0–1)
Basophils Absolute: 0 10*3/uL (ref 0.0–0.1)
EOS ABS: 0.1 10*3/uL (ref 0.0–0.7)
EOS PCT: 1 % (ref 0–5)
HEMATOCRIT: 43.9 % (ref 36.0–46.0)
HEMOGLOBIN: 15.3 g/dL — AB (ref 12.0–15.0)
Lymphocytes Relative: 30 % (ref 12–46)
Lymphs Abs: 2.5 10*3/uL (ref 0.7–4.0)
MCH: 33.8 pg (ref 26.0–34.0)
MCHC: 34.9 g/dL (ref 30.0–36.0)
MCV: 96.9 fL (ref 78.0–100.0)
MONO ABS: 0.6 10*3/uL (ref 0.1–1.0)
MONOS PCT: 8 % (ref 3–12)
Neutro Abs: 5 10*3/uL (ref 1.7–7.7)
Neutrophils Relative %: 60 % (ref 43–77)
Platelets: 200 10*3/uL (ref 150–400)
RBC: 4.53 MIL/uL (ref 3.87–5.11)
RDW: 11.8 % (ref 11.5–15.5)
WBC: 8.2 10*3/uL (ref 4.0–10.5)

## 2013-12-19 LAB — URINALYSIS, ROUTINE W REFLEX MICROSCOPIC
BILIRUBIN URINE: NEGATIVE
GLUCOSE, UA: NEGATIVE mg/dL
HGB URINE DIPSTICK: NEGATIVE
KETONES UR: NEGATIVE mg/dL
NITRITE: NEGATIVE
PH: 8 (ref 5.0–8.0)
Protein, ur: NEGATIVE mg/dL
Specific Gravity, Urine: 1.014 (ref 1.005–1.030)
Urobilinogen, UA: 0.2 mg/dL (ref 0.0–1.0)

## 2013-12-19 LAB — BASIC METABOLIC PANEL
Anion gap: 12 (ref 5–15)
BUN: 12 mg/dL (ref 6–23)
CO2: 27 mEq/L (ref 19–32)
CREATININE: 0.9 mg/dL (ref 0.50–1.10)
Calcium: 10 mg/dL (ref 8.4–10.5)
Chloride: 104 mEq/L (ref 96–112)
GFR, EST AFRICAN AMERICAN: 86 mL/min — AB (ref >90)
GFR, EST NON AFRICAN AMERICAN: 74 mL/min — AB (ref >90)
GLUCOSE: 98 mg/dL (ref 70–99)
Potassium: 3.9 mEq/L (ref 3.7–5.3)
Sodium: 143 mEq/L (ref 137–147)

## 2013-12-19 LAB — URINE MICROSCOPIC-ADD ON

## 2013-12-19 MED ORDER — DEXAMETHASONE SODIUM PHOSPHATE 10 MG/ML IJ SOLN: 10.0000 mg | Freq: Once | INTRAMUSCULAR | Status: DC

## 2013-12-19 MED ORDER — SODIUM CHLORIDE 0.9 % IV BOLUS (SEPSIS)
1000.0000 mL | Freq: Once | INTRAVENOUS | Status: AC
  Administered 2013-12-19: 1000 mL via INTRAVENOUS

## 2013-12-19 MED ORDER — METOCLOPRAMIDE HCL 5 MG/ML IJ SOLN
10.0000 mg | Freq: Once | INTRAMUSCULAR | Status: AC
  Administered 2013-12-19: 10 mg via INTRAVENOUS
  Filled 2013-12-19: qty 2

## 2013-12-19 MED ORDER — DIPHENHYDRAMINE HCL 50 MG/ML IJ SOLN
25.0000 mg | Freq: Once | INTRAMUSCULAR | Status: AC
Start: 2013-12-19 — End: 2013-12-19
  Administered 2013-12-19: 25 mg via INTRAVENOUS
  Filled 2013-12-19: qty 1

## 2013-12-19 MED ORDER — DEXAMETHASONE SODIUM PHOSPHATE 4 MG/ML IJ SOLN
10.0000 mg | Freq: Once | INTRAMUSCULAR | Status: AC
  Administered 2013-12-19: 10 mg via INTRAVENOUS

## 2013-12-19 MED ORDER — DEXAMETHASONE SODIUM PHOSPHATE 4 MG/ML IJ SOLN
INTRAMUSCULAR | Status: AC
  Administered 2013-12-19: 10 mg via INTRAVENOUS
  Filled 2013-12-19: qty 3

## 2013-12-19 NOTE — ED Notes (Signed)
Pt sts she cannot provide a urine sample at this time.

## 2013-12-19 NOTE — ED Notes (Signed)
Pt c/o migraine that began at 9:00 pm last night and pt began vomiting at 10:00pm last night. Pt also c/o right flank pain.

## 2013-12-19 NOTE — ED Notes (Signed)
Pt sts migraine feels like her previous migraines. She sts last one was 2 wks ago. She is under care of Dr. Zachery Conch, neuro. sts toradol and phenergan injections usually improve Sxs.

## 2013-12-19 NOTE — ED Notes (Signed)
Unable to provide urine sample at present

## 2013-12-19 NOTE — ED Provider Notes (Signed)
CSN: [id number]     Arrival date & time 12/19/13  1914 History   First MD Initiated Contact with Patient 12/19/13 1015     Chief Complaint  Patient presents with  . Migraine     (Consider location/radiation/quality/duration/timing/severity/associated sxs/prior Treatment) Patient is a 48 y.o. female presenting with migraines and flank pain. The history is provided by the patient. No language interpreter was used.  Migraine This is a recurrent problem. The current episode started 12 to 24 hours ago. The problem occurs constantly. The problem has not changed since onset.Associated symptoms include headaches. Pertinent negatives include no chest pain, no abdominal pain and no shortness of breath. Exacerbated by: light. Nothing relieves the symptoms. She has tried nothing for the symptoms. The treatment provided no relief.  Flank Pain This is a new problem. The current episode started more than 2 days ago. The problem occurs daily. The problem has not changed since onset.Associated symptoms include headaches. Pertinent negatives include no chest pain, no abdominal pain and no shortness of breath. Nothing aggravates the symptoms. She has tried nothing for the symptoms. The treatment provided no relief.    Past Medical History  Diagnosis Date  . Migraines   . Hypertension   . Depression   . Kidney stones    Past Surgical History  Procedure Laterality Date  . Ureteral stent placement    . Lithotripsy     No family history on file. History  Substance Use Topics  . Smoking status: Current Every Day Smoker  . Smokeless tobacco: Not on file  . Alcohol Use: No   OB History   Grav Para Term Preterm Abortions TAB SAB Ect Mult Living                 Review of Systems  Constitutional: Negative for fever, chills, diaphoresis, activity change, appetite change and fatigue.  HENT: Negative for congestion, facial swelling, rhinorrhea and sore throat.   Eyes: Negative for photophobia and  discharge.  Respiratory: Negative for cough, chest tightness and shortness of breath.   Cardiovascular: Negative for chest pain, palpitations and leg swelling.  Gastrointestinal: Negative for nausea, vomiting, abdominal pain and diarrhea.  Endocrine: Negative for polydipsia and polyuria.  Genitourinary: Positive for flank pain. Negative for dysuria, frequency, difficulty urinating and pelvic pain.  Musculoskeletal: Negative for arthralgias, back pain, neck pain and neck stiffness.  Skin: Negative for color change and wound.  Allergic/Immunologic: Negative for immunocompromised state.  Neurological: Positive for headaches. Negative for facial asymmetry, weakness and numbness.  Hematological: Does not bruise/bleed easily.  Psychiatric/Behavioral: Negative for confusion and agitation.      Allergies  Labetalol  Home Medications   Prior to Admission medications   Medication Sig Start Date End Date Taking? Authorizing Provider  carvedilol (COREG) 12.5 MG tablet Take 12.5 mg by mouth 2 (two) times daily with a meal.    Historical Provider, MD  cyclobenzaprine (FLEXERIL) 10 MG tablet Take 10 mg by mouth 3 (three) times daily as needed.    Historical Provider, MD  eletriptan (RELPAX) 40 MG tablet One tablet by mouth at onset of headache. May repeat in 2 hours if headache persists or recurs. may repeat in 2 hours if necessary    Historical Provider, MD  hydrochlorothiazide (HYDRODIURIL) 25 MG tablet Take 25 mg by mouth daily.    Historical Provider, MD  lamoTRIgine (LAMICTAL) 100 MG tablet Take 100 mg by mouth 2 (two) times daily.    Historical Provider, MD  promethazine (PHENERGAN) 25  MG tablet Take 25 mg by mouth every 6 (six) hours as needed.    Historical Provider, MD  sertraline (ZOLOFT) 100 MG tablet Take 100 mg by mouth daily.    Historical Provider, MD   BP 143/89  Pulse 80  Temp(Src) 98 F (36.7 C) (Oral)  Resp 16  Ht  (1.651 m)  Wt 210 lb (95.255 kg)  BMI 34.95 kg/m2   SpO2 100% Physical Exam  Constitutional: She is oriented to person, place, and time. She appears well-developed and well-nourished. No distress.  HENT:  Head: Normocephalic and atraumatic.  Mouth/Throat: No oropharyngeal exudate.  Eyes: Pupils are equal, round, and reactive to light.  Neck: Normal range of motion. Neck supple.  Cardiovascular: Normal rate, regular rhythm and normal heart sounds.  Exam reveals no gallop and no friction rub.   No murmur heard. Pulmonary/Chest: Effort normal and breath sounds normal. No respiratory distress. She has no wheezes. She has no rales.  Abdominal: Soft. Bowel sounds are normal. She exhibits no distension and no mass. There is no tenderness. There is no rebound and no guarding.  Musculoskeletal: Normal range of motion. She exhibits no edema and no tenderness.  Neurological: She is alert and oriented to person, place, and time. She has normal strength. She displays no atrophy and no tremor. No cranial nerve deficit or sensory deficit. She exhibits normal muscle tone. She displays a negative Romberg sign. Coordination and gait normal. GCS eye subscore is 4. GCS verbal subscore is 5. GCS motor subscore is 6.  Skin: Skin is warm and dry.  Psychiatric: She has a normal mood and affect.    ED Course  Procedures (including critical care time) Labs Review Labs Reviewed  URINALYSIS, ROUTINE W REFLEX MICROSCOPIC - Abnormal; Notable for the following:    APPearance CLOUDY (*)    Leukocytes, UA TRACE (*)    All other components within normal limits  CBC WITH DIFFERENTIAL - Abnormal; Notable for the following:    Hemoglobin 15.3 (*)    All other components within normal limits  BASIC METABOLIC PANEL - Abnormal; Notable for the following:    GFR calc non Af Amer 74 (*)    GFR calc Af Amer 86 (*)    All other components within normal limits  URINE MICROSCOPIC-ADD ON - Abnormal; Notable for the following:    Squamous Epithelial / LPF FEW (*)    Bacteria, UA  FEW (*)    All other components within normal limits  URINE CULTURE    Imaging Review US Renal  12/19/2013   CLINICAL DATA:  2-3 week history of intermittent right flank pain; history of nephrocalcinosis and urinary tract stones; previous lithotripsy in renal stent placement  EXAM: RENAL/URINARY TRACT ULTRASOUND COMPLETE  COMPARISON:  Abdominal pelvic CT scan of October 03, 2011.  FINDINGS: Right Kidney:  Length: 11.6 cm. There is a 1 cm diameter hypoechoic focus in the upper pole of the right kidney with mild enhance through transmission. There are hyperechoic foci in the renal pyramids with distal shadowing consistent with known nephrocalcinosis. There is no hydronephrosis. The renal cortex echotexture remains lower than that of the adjacent liver.  Left Kidney:  Length: 11.4 cm. Echogenicity within normal limits. Increased echotexture within the medullary pyramids is demonstrated. There is no hydronephrosis.  Bladder:  Ureteral jets were demonstrated bilaterally.  IMPRESSION: There is no hydronephrosis nor definite calcified stones. Medullary calcification is present bilaterally. The cortical echotexture is normal.   Electronically Signed   By:  David  [place]   On: 12/19/2013 12:07     EKG Interpretation None      MDM   Final diagnoses:  Migraine without aura and with status migrainosus, not intractable  Right flank pain    Pt is a 48 y.o. female with Pmhx as above who presents with several days of intermittent L flank pain, now with what she describes as a typical migraine since about 12 hrs ago w/ assoc n/v, photophobia. No fever, numbness, weakness, abdominal pain, dysuria. Symptoms similar to those experienced when she had a ureteral stent placed. On PE, VSS, pt in NAD. No focal neuro findings. No CVA tenderness, no abdominal pain.   1:15 PM Urine not infected, Cr nml. Renal US with no definite calcified stones, no hydronephrosis. Pt feeling much better after migraine cocktail. Will d/c  home. Return precautions given for new or worsening symptoms including worsening pain, fever, inability to tolerate PO.          Toy Cookey, MD 12/19/13 [phone]

## 2013-12-19 NOTE — Discharge Instructions (Signed)
Migraine Headache A migraine headache is an intense, throbbing pain on one or both sides of your head. A migraine can last for 30 minutes to several hours. CAUSES  The exact cause of a migraine headache is not always known. However, a migraine may be caused when nerves in the brain become irritated and release chemicals that cause inflammation. This causes pain. Certain things may also trigger migraines, such as:  Alcohol.  Smoking.  Stress.  Menstruation.  Aged cheeses.  Foods or drinks that contain nitrates, glutamate, aspartame, or tyramine.  Lack of sleep.  Chocolate.  Caffeine.  Hunger.  Physical exertion.  Fatigue.  Medicines used to treat chest pain (nitroglycerine), birth control pills, estrogen, and some blood pressure medicines. SIGNS AND SYMPTOMS  Pain on one or both sides of your head.  Pulsating or throbbing pain.  Severe pain that prevents daily activities.  Pain that is aggravated by any physical activity.  Nausea, vomiting, or both.  Dizziness.  Pain with exposure to bright lights, loud noises, or activity.  General sensitivity to bright lights, loud noises, or smells. Before you get a migraine, you may get warning signs that a migraine is coming (aura). An aura may include:  Seeing flashing lights.  Seeing bright spots, halos, or zigzag lines.  Having tunnel vision or blurred vision.  Having feelings of numbness or tingling.  Having trouble talking.  Having muscle weakness. DIAGNOSIS  A migraine headache is often diagnosed based on:  Symptoms.  Physical exam.  A CT scan or MRI of your head. These imaging tests cannot diagnose migraines, but they can help rule out other causes of headaches. TREATMENT Medicines may be given for pain and nausea. Medicines can also be given to help prevent recurrent migraines.  HOME CARE INSTRUCTIONS  Only take over-the-counter or prescription medicines for pain or discomfort as directed by your  health care provider. The use of long-term narcotics is not recommended.  Lie down in a dark, quiet room when you have a migraine.  Keep a journal to find out what may trigger your migraine headaches. For example, write down:  What you eat and drink.  How much sleep you get.  Any change to your diet or medicines.  Limit alcohol consumption.  Quit smoking if you smoke.  Get 7-9 hours of sleep, or as recommended by your health care provider.  Limit stress.  Keep lights dim if bright lights bother you and make your migraines worse. SEEK IMMEDIATE MEDICAL CARE IF:   Your migraine becomes severe.  You have a fever.  You have a stiff neck.  You have vision loss.  You have muscular weakness or loss of muscle control.  You start losing your balance or have trouble walking.  You feel faint or pass out.  You have severe symptoms that are different from your first symptoms. MAKE SURE YOU:   Understand these instructions.  Will watch your condition.  Will get help right away if you are not doing well or get worse. Document Released: 04/12/2005 Document Revised: 08/27/2013 Document Reviewed: 12/18/2012 [facility] Patient Information 2015 [place], [place]. This information is not intended to replace advice given to you by your health care provider. Make sure you discuss any questions you have with your health care provider.  Flank Pain Flank pain refers to pain that is located on the side of the body between the upper abdomen and the back. The pain may occur over a short period of time (acute) or may be long-term or  reoccurring (chronic). It may be mild or severe. Flank pain can be caused by many things. CAUSES  Some of the more common causes of flank pain include:  Muscle strains.   Muscle spasms.   A disease of your spine (vertebral disk disease).   A lung infection (pneumonia).   Fluid around your lungs (pulmonary edema).   A kidney infection.   Kidney stones.    A very painful skin rash caused by the chickenpox virus (shingles).   Gallbladder disease.  HOME CARE INSTRUCTIONS  Home care will depend on the cause of your pain. In general,  Rest as directed by your caregiver.  Drink enough fluids to keep your urine clear or pale yellow.  Only take over-the-counter or prescription medicines as directed by your caregiver. Some medicines may help relieve the pain.  Tell your caregiver about any changes in your pain.  Follow up with your caregiver as directed. SEEK IMMEDIATE MEDICAL CARE IF:   Your pain is not controlled with medicine.   You have new or worsening symptoms.  Your pain increases.   You have abdominal pain.   You have shortness of breath.   You have persistent nausea or vomiting.   You have swelling in your abdomen.   You feel faint or pass out.   You have blood in your urine.  You have a fever or persistent symptoms for more than 2-3 days.  You have a fever and your symptoms suddenly get worse. MAKE SURE YOU:   Understand these instructions.  Will watch your condition.  Will get help right away if you are not doing well or get worse. Document Released: 06/03/2005 Document Revised: 01/05/2012 Document Reviewed: 11/25/2011 [facility] Patient Information 2015 [place], [place]. This information is not intended to replace advice given to you by your health care provider. Make sure you discuss any questions you have with your health care provider.

## 2013-12-20 LAB — URINE CULTURE: Special Requests: NORMAL

## 2014-01-03 ENCOUNTER — Emergency Department (HOSPITAL_BASED_OUTPATIENT_CLINIC_OR_DEPARTMENT_OTHER)
Admission: EM | Admit: 2014-01-03 | Discharge: 2014-01-03 | Disposition: A | Discharge status: Home / Self Care | Payer: BC Managed Care – PPO | Attending: Emergency Medicine | Admitting: Emergency Medicine

## 2014-01-03 ENCOUNTER — Encounter (HOSPITAL_BASED_OUTPATIENT_CLINIC_OR_DEPARTMENT_OTHER): Payer: Self-pay | Admitting: Emergency Medicine

## 2014-01-03 DIAGNOSIS — I10 Essential (primary) hypertension: Secondary | ICD-10-CM | POA: Diagnosis not present

## 2014-01-03 DIAGNOSIS — Y929 Unspecified place or not applicable: Secondary | ICD-10-CM | POA: Insufficient documentation

## 2014-01-03 DIAGNOSIS — IMO0001 Reserved for inherently not codable concepts without codable children: Secondary | ICD-10-CM | POA: Diagnosis not present

## 2014-01-03 DIAGNOSIS — F17200 Nicotine dependence, unspecified, uncomplicated: Secondary | ICD-10-CM | POA: Insufficient documentation

## 2014-01-03 DIAGNOSIS — Z87442 Personal history of urinary calculi: Secondary | ICD-10-CM | POA: Diagnosis not present

## 2014-01-03 DIAGNOSIS — W57XXXA Bitten or stung by nonvenomous insect and other nonvenomous arthropods, initial encounter: Secondary | ICD-10-CM

## 2014-01-03 DIAGNOSIS — G43909 Migraine, unspecified, not intractable, without status migrainosus: Secondary | ICD-10-CM | POA: Diagnosis not present

## 2014-01-03 DIAGNOSIS — Y939 Activity, unspecified: Secondary | ICD-10-CM | POA: Insufficient documentation

## 2014-01-03 DIAGNOSIS — Z79899 Other long term (current) drug therapy: Secondary | ICD-10-CM | POA: Diagnosis not present

## 2014-01-03 DIAGNOSIS — F3289 Other specified depressive episodes: Secondary | ICD-10-CM | POA: Insufficient documentation

## 2014-01-03 DIAGNOSIS — F329 Major depressive disorder, single episode, unspecified: Secondary | ICD-10-CM | POA: Insufficient documentation

## 2014-01-03 MED ORDER — HYDROCORTISONE 1 % EX CREA: TOPICAL_CREAM | CUTANEOUS | Status: DC

## 2014-01-03 MED ORDER — HYDROCODONE-ACETAMINOPHEN 5-325 MG PO TABS: 2.0000 | ORAL_TABLET | Freq: Once | ORAL | Status: DC

## 2014-01-03 MED ORDER — IBUPROFEN 800 MG PO TABS: 800.0000 mg | ORAL_TABLET | Freq: Three times a day (TID) | ORAL | Status: DC

## 2014-01-03 NOTE — ED Notes (Signed)
Thinks she was bit by an insect. She did not see anything. Her left arm is red, swollen and painful.

## 2014-01-03 NOTE — Discharge Instructions (Signed)
Use ibuprofen up to 3 times a day as needed for pain. Apply hydrocortisone cream to affected area. Use Benadryl for itching. Return to the ER or follow up with her primary care physician if symptoms worsen.   Insect Bite Mosquitoes, flies, fleas, bedbugs, and many other insects can bite. Insect bites are different from insect stings. A sting is when venom is injected into the skin. Some insect bites can transmit infectious diseases. SYMPTOMS  Insect bites usually turn red, swell, and itch for 2 to 4 days. They often go away on their own. TREATMENT  Your caregiver may prescribe antibiotic medicines if a bacterial infection develops in the bite. HOME CARE INSTRUCTIONS  Do not scratch the bite area.  Keep the bite area clean and dry. Wash the bite area thoroughly with soap and water.  Put ice or cool compresses on the bite area.  Put ice in a plastic bag.  Place a towel between your skin and the bag.  Leave the ice on for 20 minutes, 4 times a day for the first 2 to 3 days, or as directed.  You may apply a baking soda paste, cortisone cream, or calamine lotion to the bite area as directed by your caregiver. This can help reduce itching and swelling.  Only take over-the-counter or prescription medicines as directed by your caregiver.  If you are given antibiotics, take them as directed. Finish them even if you start to feel better. You may need a tetanus shot if:  You cannot remember when you had your last tetanus shot.  You have never had a tetanus shot.  The injury broke your skin. If you get a tetanus shot, your arm may swell, get red, and feel warm to the touch. This is common and not a problem. If you need a tetanus shot and you choose not to have one, there is a rare chance of getting tetanus. Sickness from tetanus can be serious. SEEK IMMEDIATE MEDICAL CARE IF:   You have increased pain, redness, or swelling in the bite area.  You see a red line on the skin coming from the  bite.  You have a fever.  You have joint pain.  You have a headache or neck pain.  You have unusual weakness.  You have a rash.  You have chest pain or shortness of breath.  You have abdominal pain, nausea, or vomiting.  You feel unusually tired or sleepy. MAKE SURE YOU:   Understand these instructions.  Will watch your condition.  Will get help right away if you are not doing well or get worse. Document Released: 05/20/2004 Document Revised: 07/05/2011 Document Reviewed: 11/11/2010 [facility] Patient Information 2015 [place], [place]. This information is not intended to replace advice given to you by your health care provider. Make sure you discuss any questions you have with your health care provider.

## 2014-01-03 NOTE — ED Provider Notes (Signed)
CSN: [id number]     Arrival date & time 01/03/14  1933 History   First MD Initiated Contact with Patient 01/03/14 2130     Chief Complaint  Patient presents with  . Insect Bite     (Consider location/radiation/quality/duration/timing/severity/associated sxs/prior Treatment) HPI Heather Gomez is a 48 year old female who presents the ED with an insect bite. Patient states last night she noticed a "sore spot" which appeared acutely on her medial aspect of her left arm. Patient states she thinks she may have been bitten by an insect. Patient states the pain persisted through the night last night, and has increased today. Patient states the pain is in her entire arm, it is a "throbbing" sensation, movement makes it worse, there are no alleviating factors. Patient states she has not tried taking anything for pain. Patient denies any associated wheezing, shortness of breath, hives, dizziness, weakness, fever, nausea, vomiting, chest pain.   Past Medical History  Diagnosis Date  . Migraines   . Hypertension   . Depression   . Kidney stones    Past Surgical History  Procedure Laterality Date  . Ureteral stent placement    . Lithotripsy     No family history on file. History  Substance Use Topics  . Smoking status: Current Every Day Smoker  . Smokeless tobacco: Not on file  . Alcohol Use: No   OB History   Grav Para Term Preterm Abortions TAB SAB Ect Mult Living                 Review of Systems  Constitutional: Negative for fever and chills.  Skin: Positive for rash.      Allergies  Labetalol  Home Medications   Prior to Admission medications   Medication Sig Start Date End Date Taking? Authorizing Provider  carvedilol (COREG) 12.5 MG tablet Take 12.5 mg by mouth 2 (two) times daily with a meal.    Historical Provider, MD  cyclobenzaprine (FLEXERIL) 10 MG tablet Take 10 mg by mouth 3 (three) times daily as needed.    Historical Provider, MD  eletriptan (RELPAX) 40 MG  tablet One tablet by mouth at onset of headache. May repeat in 2 hours if headache persists or recurs. may repeat in 2 hours if necessary    Historical Provider, MD  hydrochlorothiazide (HYDRODIURIL) 25 MG tablet Take 25 mg by mouth daily.    Historical Provider, MD  hydrocortisone cream 1 % Apply to affected area 2 times daily 01/03/14   Monte Fantasia, PA-C  ibuprofen (ADVIL,MOTRIN) 800 MG tablet Take 1 tablet (800 mg total) by mouth 3 (three) times daily. 01/03/14   Monte Fantasia, PA-C  lamoTRIgine (LAMICTAL) 100 MG tablet Take 100 mg by mouth 2 (two) times daily.    Historical Provider, MD  promethazine (PHENERGAN) 25 MG tablet Take 25 mg by mouth every 6 (six) hours as needed.    Historical Provider, MD  sertraline (ZOLOFT) 100 MG tablet Take 100 mg by mouth daily.    Historical Provider, MD   BP 118/81  Pulse 76  Temp(Src) 98.2 F (36.8 C) (Oral)  Resp 20  Ht  (1.651 m)  Wt 210 lb (95.255 kg)  BMI 34.95 kg/m2  SpO2 100% Physical Exam  Nursing note and vitals reviewed. Constitutional: She is oriented to person, place, and time. She appears well-developed and well-nourished. No distress.  HENT:  Head: Normocephalic and atraumatic.  Eyes: Right eye exhibits no discharge. Left eye exhibits no discharge. No scleral icterus.  Neck: Normal range of motion.  Pulmonary/Chest: Effort normal. No respiratory distress.  Musculoskeletal: Normal range of motion.  Left arm exam: Patient has full active and passive range of motion to left arm with mild discomfort noted with movement. Radial pulses 2+, distal sensation intact. Capillary refill less than 2 seconds distally.  Neurological: She is alert and oriented to person, place, and time.  Skin: Skin is warm and dry. She is not diaphoretic.  5 cm diameter area of mild erythema noted to patient's medial bicep region. No obvious surrounding erythema, edema. No induration, fluctuance, opening noted. No surrounding erythema, edema noted to  patient's arm.  Psychiatric: She has a normal mood and affect.    ED Course  Procedures (including critical care time) Labs Review Labs Reviewed - No data to display  Imaging Review No results found.   EKG Interpretation None      MDM   Final diagnoses:  Insect bite    48 year old female with 24 hours of left arm "throbbing. Patient states she thinks she may have been bitten by a bug yesterday on the inside of her upper arm. Patient states she did not notice an actual insect, , noticed an acute onset of pain, and redness to the upper medial side of her left arm. Exam tonight unremarkable for abscess, cellulitis, and is more consistent with a bite or sting from an insect. No urticaria anywhere else on her body. No shortness of breath, dizziness, nausea, vomiting, signs of anaphylaxis. Patient prescribed hydrocortisone cream to put on the area, and given Motrin for pain relief. We encouraged patient to followup with her primary care physician. I also encouraged patient to call or return to ER should her symptoms progress, worsen or should she have any questions or concerns.  BP 118/81  Pulse 76  Temp(Src) 98.2 F (36.8 C) (Oral)  Resp 20  Ht  (1.651 m)  Wt 210 lb (95.255 kg)  BMI 34.95 kg/m2  SpO2 100%   Signed,  Ladona Mow, PA-C 12:14 AM   This patient discussed with Dr. Elwin Mocha, M.D.    Monte Fantasia, PA-C 01/04/14 [phone]

## 2014-01-04 NOTE — ED Provider Notes (Signed)
Medical screening examination/treatment/procedure(s) were performed by non-physician practitioner and as supervising physician I was immediately available for consultation/collaboration.   EKG Interpretation None        Terrance Lanahan, MD 01/04/14 2324 

## 2014-01-09 ENCOUNTER — Emergency Department (HOSPITAL_BASED_OUTPATIENT_CLINIC_OR_DEPARTMENT_OTHER)
Admission: EM | Admit: 2014-01-09 | Discharge: 2014-01-09 | Disposition: A | Discharge status: Home / Self Care | Payer: BC Managed Care – PPO | Attending: Emergency Medicine | Admitting: Emergency Medicine

## 2014-01-09 ENCOUNTER — Encounter (HOSPITAL_BASED_OUTPATIENT_CLINIC_OR_DEPARTMENT_OTHER): Payer: Self-pay | Admitting: Emergency Medicine

## 2014-01-09 DIAGNOSIS — F329 Major depressive disorder, single episode, unspecified: Secondary | ICD-10-CM | POA: Insufficient documentation

## 2014-01-09 DIAGNOSIS — IMO0002 Reserved for concepts with insufficient information to code with codable children: Secondary | ICD-10-CM | POA: Insufficient documentation

## 2014-01-09 DIAGNOSIS — G43909 Migraine, unspecified, not intractable, without status migrainosus: Secondary | ICD-10-CM | POA: Diagnosis present

## 2014-01-09 DIAGNOSIS — Z791 Long term (current) use of non-steroidal anti-inflammatories (NSAID): Secondary | ICD-10-CM | POA: Insufficient documentation

## 2014-01-09 DIAGNOSIS — Z79899 Other long term (current) drug therapy: Secondary | ICD-10-CM | POA: Diagnosis not present

## 2014-01-09 DIAGNOSIS — F3289 Other specified depressive episodes: Secondary | ICD-10-CM | POA: Insufficient documentation

## 2014-01-09 DIAGNOSIS — G43009 Migraine without aura, not intractable, without status migrainosus: Secondary | ICD-10-CM

## 2014-01-09 DIAGNOSIS — I10 Essential (primary) hypertension: Secondary | ICD-10-CM | POA: Insufficient documentation

## 2014-01-09 DIAGNOSIS — F17200 Nicotine dependence, unspecified, uncomplicated: Secondary | ICD-10-CM | POA: Insufficient documentation

## 2014-01-09 DIAGNOSIS — Z87442 Personal history of urinary calculi: Secondary | ICD-10-CM | POA: Diagnosis not present

## 2014-01-09 MED ORDER — METOCLOPRAMIDE HCL 5 MG/ML IJ SOLN
10.0000 mg | Freq: Once | INTRAMUSCULAR | Status: AC
  Administered 2014-01-09: 10 mg via INTRAVENOUS
  Filled 2014-01-09: qty 2

## 2014-01-09 MED ORDER — SODIUM CHLORIDE 0.9 % IV BOLUS (SEPSIS)
1000.0000 mL | Freq: Once | INTRAVENOUS | Status: AC
  Administered 2014-01-09: 1000 mL via INTRAVENOUS

## 2014-01-09 MED ORDER — ONDANSETRON HCL 4 MG/2ML IJ SOLN
4.0000 mg | Freq: Once | INTRAMUSCULAR | Status: AC
  Administered 2014-01-09: 4 mg via INTRAVENOUS
  Filled 2014-01-09: qty 2

## 2014-01-09 MED ORDER — KETOROLAC TROMETHAMINE 30 MG/ML IJ SOLN
30.0000 mg | Freq: Once | INTRAMUSCULAR | Status: AC
  Administered 2014-01-09: 30 mg via INTRAVENOUS
  Filled 2014-01-09: qty 1

## 2014-01-09 MED ORDER — DIPHENHYDRAMINE HCL 50 MG/ML IJ SOLN
25.0000 mg | Freq: Once | INTRAMUSCULAR | Status: AC
  Administered 2014-01-09: 25 mg via INTRAVENOUS
  Filled 2014-01-09: qty 1

## 2014-01-09 NOTE — ED Provider Notes (Signed)
CSN: [id number]     Arrival date & time 01/09/14  1953 History  This chart was scribed for Heather Octave, MD by Luisa Dago, ED Scribe. This patient was seen in room MH12/MH12 and the patient's care was started at 8:09 PM.    Chief Complaint  Patient presents with  . Migraine   The history is provided by the patient. No language interpreter was used.   HPI Comments: Heather Gomez is a 48 y.o. female with a hx of migraines presents to the Emergency Department complaining of a migraine onset that started 3PM today. Pt is also complaining of associated nausea. Ms. Droge states that the pain is located above her eyes. She states that she has Toradol and Phenergan at home but she usually takes Ibuprofen with immediate relief. However, this current episode after taking 4 Ibuprofen's, it has not resolved. She did not take any Toradol or Phenergan because she didn't think it would alleviate the pain. Pt also endorses photophobia and nausea. She denies any fever, chills, emesis, SOB, chest pain, medicinal allergies, numbness, weakness, or chest pain.  Past Medical History  Diagnosis Date  . Migraines   . Hypertension   . Depression   . Kidney stones    Past Surgical History  Procedure Laterality Date  . Ureteral stent placement    . Lithotripsy     History reviewed. No pertinent family history. History  Substance Use Topics  . Smoking status: Current Every Day Smoker -- 1.00 packs/day    Types: Cigarettes  . Smokeless tobacco: Not on file  . Alcohol Use: No   OB History   Grav Para Term Preterm Abortions TAB SAB Ect Mult Living                 Review of Systems A complete 10 system review of systems was obtained and all systems are negative except as noted in the HPI and PMH.   Allergies  Labetalol  Home Medications   Prior to Admission medications   Medication Sig Start Date End Date Taking? Authorizing Provider  carvedilol (COREG) 12.5 MG tablet Take 12.5 mg  by mouth 2 (two) times daily with a meal.   Yes Historical Provider, MD  cyclobenzaprine (FLEXERIL) 10 MG tablet Take 10 mg by mouth 3 (three) times daily as needed.   Yes Historical Provider, MD  hydrochlorothiazide (HYDRODIURIL) 25 MG tablet Take 25 mg by mouth daily.   Yes Historical Provider, MD  lamoTRIgine (LAMICTAL) 100 MG tablet Take 100 mg by mouth 2 (two) times daily.   Yes Historical Provider, MD  promethazine (PHENERGAN) 25 MG tablet Take 25 mg by mouth every 6 (six) hours as needed.   Yes Historical Provider, MD  sertraline (ZOLOFT) 100 MG tablet Take 100 mg by mouth daily.   Yes Historical Provider, MD  eletriptan (RELPAX) 40 MG tablet One tablet by mouth at onset of headache. May repeat in 2 hours if headache persists or recurs. may repeat in 2 hours if necessary    Historical Provider, MD  hydrocortisone cream 1 % Apply to affected area 2 times daily 01/03/14   Monte Fantasia, PA-C  ibuprofen (ADVIL,MOTRIN) 800 MG tablet Take 1 tablet (800 mg total) by mouth 3 (three) times daily. 01/03/14   Monte Fantasia, PA-C   Triage Vitals:BP 130/98  Pulse 88  Temp(Src) 98 F (36.7 C) (Oral)  Resp 18  Ht  (1.651 m)  Wt 210 lb (95.255 kg)  BMI 34.95 kg/m2  SpO2 99%  Physical Exam  Nursing note and vitals reviewed. Constitutional: She is oriented to person, place, and time. She appears well-developed and well-nourished. No distress.  HENT:  Head: Normocephalic and atraumatic.  Mouth/Throat: Oropharynx is clear and moist. No oropharyngeal exudate.  No temporal artery tenderness  Eyes: Conjunctivae and EOM are normal. Pupils are equal, round, and reactive to light.  Photophobia.  Neck: Normal range of motion. Neck supple.  No meningismus.  Cardiovascular: Normal rate, regular rhythm, normal heart sounds and intact distal pulses.   No murmur heard. Pulmonary/Chest: Effort normal and breath sounds normal. No respiratory distress.  Abdominal: Soft. There is no tenderness. There is  no rebound and no guarding.  Musculoskeletal: Normal range of motion. She exhibits no edema and no tenderness.  Neurological: She is alert and oriented to person, place, and time. No cranial nerve deficit. She exhibits normal muscle tone. Coordination normal.  No ataxia on finger to nose bilaterally. No pronator drift. 5/5 strength throughout. CN 2-12 intact. Negative Romberg. Equal grip strength. Sensation intact. Gait is normal.   Skin: Skin is warm.  Psychiatric: She has a normal mood and affect. Her behavior is normal.    ED Course  Procedures (including critical care time)  DIAGNOSTIC STUDIES: Oxygen Saturation is 99% on RA, normal by my interpretation.    COORDINATION OF CARE: 8:14 PM- Pt advised of plan for treatment and pt agrees.  Medications  sodium chloride 0.9 % bolus 1,000 mL (0 mLs Intravenous Stopped 01/09/14 2123)  ondansetron (ZOFRAN) injection 4 mg (4 mg Intravenous Given 01/09/14 2033)  ketorolac (TORADOL) 30 MG/ML injection 30 mg (30 mg Intravenous Given 01/09/14 2040)  metoCLOPramide (REGLAN) injection 10 mg (10 mg Intravenous Given 01/09/14 2035)  diphenhydrAMINE (BENADRYL) injection 25 mg (25 mg Intravenous Given 01/09/14 2038)   9:25 PM- Pt was recheck and she states that she is doing much better. Will give pt water to see if she can tolerate liquids.   MDM   Final diagnoses:  Nonintractable migraine, unspecified migraine type   Gradual onset headache typical of previous migraines associated with nausea and photophobia. No fever. No focal weakness, numbness or tingling. Denies thunderclap onset.  Typical of previous migraines. Low suspicion for subarachnoid hemorrhage, meningitis, temporal arteritis  Headache is improved with migraine cocktail Toradol, Benadryl, Zofran. And Reglan. Patient is tolerating by mouth and ambulatory. She is requesting to go home.  She stable for outpatient followup with her neurologist and PCP.   I personally performed the  services described in this documentation, which was scribed in my presence. The recorded information has been reviewed and is accurate.    Heather Octave, MD 01/09/14 [phone]

## 2014-01-09 NOTE — ED Notes (Signed)
C/o migraine onset 1500 today. Pain is over eyes. No visual problems. C/o nausea, no vomiting. States toradol and phenergan usually work for her pain.

## 2014-01-09 NOTE — Discharge Instructions (Signed)

## 2014-01-09 NOTE — ED Notes (Signed)
C/o ha onset 1500 this pm, w nausea,  Took 4 ibu at 3  No relief

## 2014-02-12 ENCOUNTER — Emergency Department (HOSPITAL_BASED_OUTPATIENT_CLINIC_OR_DEPARTMENT_OTHER)
Admission: EM | Admit: 2014-02-12 | Discharge: 2014-02-13 | Disposition: A | Discharge status: Home / Self Care | Payer: BC Managed Care – PPO | Attending: Emergency Medicine | Admitting: Emergency Medicine

## 2014-02-12 ENCOUNTER — Encounter (HOSPITAL_BASED_OUTPATIENT_CLINIC_OR_DEPARTMENT_OTHER): Payer: Self-pay | Admitting: Emergency Medicine

## 2014-02-12 DIAGNOSIS — Z791 Long term (current) use of non-steroidal anti-inflammatories (NSAID): Secondary | ICD-10-CM | POA: Insufficient documentation

## 2014-02-12 DIAGNOSIS — G43001 Migraine without aura, not intractable, with status migrainosus: Secondary | ICD-10-CM | POA: Insufficient documentation

## 2014-02-12 DIAGNOSIS — Z87442 Personal history of urinary calculi: Secondary | ICD-10-CM | POA: Insufficient documentation

## 2014-02-12 DIAGNOSIS — Z720 Tobacco use: Secondary | ICD-10-CM | POA: Diagnosis not present

## 2014-02-12 DIAGNOSIS — I10 Essential (primary) hypertension: Secondary | ICD-10-CM | POA: Diagnosis not present

## 2014-02-12 DIAGNOSIS — F329 Major depressive disorder, single episode, unspecified: Secondary | ICD-10-CM | POA: Insufficient documentation

## 2014-02-12 DIAGNOSIS — G43909 Migraine, unspecified, not intractable, without status migrainosus: Secondary | ICD-10-CM | POA: Diagnosis present

## 2014-02-12 DIAGNOSIS — Z79899 Other long term (current) drug therapy: Secondary | ICD-10-CM | POA: Insufficient documentation

## 2014-02-12 MED ORDER — METOCLOPRAMIDE HCL 5 MG/ML IJ SOLN
10.0000 mg | Freq: Once | INTRAMUSCULAR | Status: AC
  Administered 2014-02-12: 10 mg via INTRAMUSCULAR
  Filled 2014-02-12: qty 2

## 2014-02-12 MED ORDER — DIPHENHYDRAMINE HCL 50 MG/ML IJ SOLN
INTRAMUSCULAR | Status: AC
  Filled 2014-02-12: qty 1

## 2014-02-12 MED ORDER — DEXAMETHASONE SODIUM PHOSPHATE 10 MG/ML IJ SOLN
10.0000 mg | Freq: Once | INTRAMUSCULAR | Status: AC
  Administered 2014-02-12: 10 mg via INTRAMUSCULAR
  Filled 2014-02-12: qty 1

## 2014-02-12 MED ORDER — DIPHENHYDRAMINE HCL 50 MG/ML IJ SOLN
25.0000 mg | Freq: Once | INTRAMUSCULAR | Status: AC
  Administered 2014-02-12: 25 mg via INTRAMUSCULAR
  Filled 2014-02-12: qty 1

## 2014-02-12 MED ORDER — KETOROLAC TROMETHAMINE 60 MG/2ML IM SOLN
60.0000 mg | Freq: Once | INTRAMUSCULAR | Status: AC
  Administered 2014-02-12: 60 mg via INTRAMUSCULAR
  Filled 2014-02-12: qty 2

## 2014-02-12 MED ORDER — DEXAMETHASONE SODIUM PHOSPHATE 10 MG/ML IJ SOLN
INTRAMUSCULAR | Status: AC
  Filled 2014-02-12: qty 1

## 2014-02-12 MED ORDER — KETOROLAC TROMETHAMINE 60 MG/2ML IM SOLN
INTRAMUSCULAR | Status: AC
  Filled 2014-02-12: qty 2

## 2014-02-12 NOTE — ED Notes (Signed)
Pt c/o migraine x 2 days.  ?

## 2014-02-13 ENCOUNTER — Encounter (HOSPITAL_BASED_OUTPATIENT_CLINIC_OR_DEPARTMENT_OTHER): Payer: Self-pay | Admitting: Emergency Medicine

## 2014-02-13 NOTE — ED Provider Notes (Signed)
CSN: [id number]     Arrival date & time 02/12/14  2113 History   First MD Initiated Contact with Patient 02/12/14 2305     Chief Complaint  Patient presents with  . Migraine     (Consider location/radiation/quality/duration/timing/severity/associated sxs/prior Treatment) Patient is a 48 y.o. female presenting with migraines. The history is provided by the patient.  Migraine This is a recurrent problem. The current episode started 2 days ago. The problem occurs constantly. The problem has not changed since onset.Pertinent negatives include no chest pain, no abdominal pain and no shortness of breath. Nothing aggravates the symptoms. Nothing relieves the symptoms. Treatments tried: a dose of ibuprofen. The treatment provided no relief.  Not sudden onset not worst HA of life.  No f/c/r.  No rashes no travel no neck pain or stiffness no tick exposure  Past Medical History  Diagnosis Date  . Migraines   . Hypertension   . Depression   . Kidney stones    Past Surgical History  Procedure Laterality Date  . Ureteral stent placement    . Lithotripsy     History reviewed. No pertinent family history. History  Substance Use Topics  . Smoking status: Current Every Day Smoker -- 1.00 packs/day    Types: Cigarettes  . Smokeless tobacco: Not on file  . Alcohol Use: No   OB History   Grav Para Term Preterm Abortions TAB SAB Ect Mult Living                 Review of Systems  Constitutional: Negative for fever.  Respiratory: Negative for shortness of breath.   Cardiovascular: Negative for chest pain.  Gastrointestinal: Negative for abdominal pain.  Musculoskeletal: Negative for neck pain and neck stiffness.  Neurological: Negative for dizziness, facial asymmetry, speech difficulty, weakness and numbness.  All other systems reviewed and are negative.     Allergies  Labetalol  Home Medications   Prior to Admission medications   Medication Sig Start Date End Date Taking?  Authorizing Provider  carvedilol (COREG) 12.5 MG tablet Take 12.5 mg by mouth 2 (two) times daily with a meal.    Historical Provider, MD  cyclobenzaprine (FLEXERIL) 10 MG tablet Take 10 mg by mouth 3 (three) times daily as needed.    Historical Provider, MD  eletriptan (RELPAX) 40 MG tablet One tablet by mouth at onset of headache. May repeat in 2 hours if headache persists or recurs. may repeat in 2 hours if necessary    Historical Provider, MD  hydrochlorothiazide (HYDRODIURIL) 25 MG tablet Take 25 mg by mouth daily.    Historical Provider, MD  hydrocortisone cream 1 % Apply to affected area 2 times daily 01/03/14   Monte FantasiaJoseph W Mintz, PA-C  ibuprofen (ADVIL,MOTRIN) 800 MG tablet Take 1 tablet (800 mg total) by mouth 3 (three) times daily. 01/03/14   Monte FantasiaJoseph W Mintz, PA-C  lamoTRIgine (LAMICTAL) 100 MG tablet Take 100 mg by mouth 2 (two) times daily.    Historical Provider, MD  promethazine (PHENERGAN) 25 MG tablet Take 25 mg by mouth every 6 (six) hours as needed.    Historical Provider, MD  sertraline (ZOLOFT) 100 MG tablet Take 100 mg by mouth daily.    Historical Provider, MD   BP 151/93  Pulse 72  Temp(Src) 98.3 F (36.8 C) (Oral)  Resp 16  Ht 5\' 5"  (1.651 m)  Wt 209 lb (94.802 kg)  BMI 34.78 kg/m2  SpO2 100% Physical Exam  Constitutional: She is oriented to person, place,  and time. She appears well-developed and well-nourished. No distress.  HENT:  Head: Normocephalic and atraumatic.  Mouth/Throat: Oropharynx is clear and moist. No oropharyngeal exudate.  Eyes: Conjunctivae and EOM are normal. Pupils are equal, round, and reactive to light.  Intact speech and cognition  Neck: Normal range of motion. Neck supple.  No meningismus  Cardiovascular: Normal rate, regular rhythm and intact distal pulses.   Pulmonary/Chest: Effort normal and breath sounds normal. She has no wheezes. She has no rales.  Abdominal: Soft. Bowel sounds are normal. There is no tenderness. There is no rebound and  no guarding.  Musculoskeletal: Normal range of motion. She exhibits no edema and no tenderness.  Lymphadenopathy:    She has no cervical adenopathy.  Neurological: She is alert and oriented to person, place, and time. She has normal reflexes. No cranial nerve deficit.  Skin: Skin is warm and dry.  Psychiatric: She has a normal mood and affect.    ED Course  Procedures (including critical care time) Labs Review Labs Reviewed - No data to display  Imaging Review No results found.   EKG Interpretation None      MDM   Final diagnoses:  None   No red flags.  No indication for CT or Lp.  No concern for meningitis or SAH.  Exam is not consistent with cavernous sinus thrombosis.  Intact EOMI, no changes in cognition.   Pain markedly better post cocktail.  Will discharge return for fevers, stiff neck, weakness numbness or any concerning symptoms.      Jasmine AweApril K Weston Fulco-Rasch, MD 02/13/14 [phone]

## 2014-02-13 NOTE — Discharge Instructions (Signed)

## 2014-04-07 ENCOUNTER — Emergency Department (HOSPITAL_BASED_OUTPATIENT_CLINIC_OR_DEPARTMENT_OTHER)
Admission: EM | Admit: 2014-04-07 | Discharge: 2014-04-07 | Disposition: A | Discharge status: Home / Self Care | Payer: BC Managed Care – PPO | Attending: Emergency Medicine | Admitting: Emergency Medicine

## 2014-04-07 ENCOUNTER — Encounter (HOSPITAL_BASED_OUTPATIENT_CLINIC_OR_DEPARTMENT_OTHER): Payer: Self-pay

## 2014-04-07 DIAGNOSIS — F329 Major depressive disorder, single episode, unspecified: Secondary | ICD-10-CM | POA: Diagnosis not present

## 2014-04-07 DIAGNOSIS — Z720 Tobacco use: Secondary | ICD-10-CM | POA: Insufficient documentation

## 2014-04-07 DIAGNOSIS — Z7952 Long term (current) use of systemic steroids: Secondary | ICD-10-CM | POA: Insufficient documentation

## 2014-04-07 DIAGNOSIS — G43809 Other migraine, not intractable, without status migrainosus: Secondary | ICD-10-CM | POA: Insufficient documentation

## 2014-04-07 DIAGNOSIS — Z79899 Other long term (current) drug therapy: Secondary | ICD-10-CM | POA: Insufficient documentation

## 2014-04-07 DIAGNOSIS — I10 Essential (primary) hypertension: Secondary | ICD-10-CM | POA: Diagnosis not present

## 2014-04-07 DIAGNOSIS — Z87442 Personal history of urinary calculi: Secondary | ICD-10-CM | POA: Diagnosis not present

## 2014-04-07 DIAGNOSIS — G43909 Migraine, unspecified, not intractable, without status migrainosus: Secondary | ICD-10-CM | POA: Diagnosis present

## 2014-04-07 MED ORDER — ONDANSETRON HCL 4 MG/2ML IJ SOLN
4.0000 mg | Freq: Once | INTRAMUSCULAR | Status: AC
  Administered 2014-04-07: 4 mg via INTRAVENOUS
  Filled 2014-04-07: qty 2

## 2014-04-07 MED ORDER — DIPHENHYDRAMINE HCL 50 MG/ML IJ SOLN
25.0000 mg | Freq: Once | INTRAMUSCULAR | Status: AC
  Administered 2014-04-07: 25 mg via INTRAVENOUS
  Filled 2014-04-07: qty 1

## 2014-04-07 MED ORDER — KETOROLAC TROMETHAMINE 30 MG/ML IJ SOLN
30.0000 mg | Freq: Once | INTRAMUSCULAR | Status: AC
  Administered 2014-04-07: 30 mg via INTRAVENOUS
  Filled 2014-04-07: qty 1

## 2014-04-07 MED ORDER — DEXAMETHASONE SODIUM PHOSPHATE 10 MG/ML IJ SOLN
10.0000 mg | Freq: Once | INTRAMUSCULAR | Status: AC
  Administered 2014-04-07: 10 mg via INTRAVENOUS
  Filled 2014-04-07: qty 1

## 2014-04-07 MED ORDER — OXYCODONE HCL 5 MG PO TABS: 5.0000 mg | ORAL_TABLET | ORAL | Status: DC | PRN

## 2014-04-07 MED ORDER — MORPHINE SULFATE 4 MG/ML IJ SOLN
4.0000 mg | Freq: Once | INTRAMUSCULAR | Status: AC
  Administered 2014-04-07: 4 mg via INTRAVENOUS
  Filled 2014-04-07: qty 1

## 2014-04-07 MED ORDER — SODIUM CHLORIDE 0.9 % IV BOLUS (SEPSIS)
1000.0000 mL | Freq: Once | INTRAVENOUS | Status: AC
  Administered 2014-04-07: 1000 mL via INTRAVENOUS

## 2014-04-07 MED ORDER — METOCLOPRAMIDE HCL 5 MG/ML IJ SOLN
10.0000 mg | Freq: Once | INTRAMUSCULAR | Status: AC
  Administered 2014-04-07: 10 mg via INTRAVENOUS
  Filled 2014-04-07: qty 2

## 2014-04-07 NOTE — ED Provider Notes (Signed)
CSN: [id number]     Arrival date & time 04/07/14  0912 History   First MD Initiated Contact with Patient 04/07/14 [phone]     Chief Complaint  Patient presents with  . Migraine      HPI  She presented right relation of a migraine headache. Has intermittent headaches. Has had much fewer headaches since being started on Lamictal within the last year. He estimates approximately 1 episode per month where she requires home medications. Current headache the last 3-4 days. Bifrontal. Typical for her. No atypical features. No fevers no chills nausea no vomiting no neck pain or neurological symptoms.  Past Medical History  Diagnosis Date  . Migraines   . Hypertension   . Depression   . Kidney stones    Past Surgical History  Procedure Laterality Date  . Ureteral stent placement    . Lithotripsy     No family history on file. History  Substance Use Topics  . Smoking status: Current Every Day Smoker -- 1.00 packs/day    Types: Cigarettes  . Smokeless tobacco: Not on file  . Alcohol Use: No   OB History    No data available     Review of Systems  Constitutional: Negative for fever, chills, diaphoresis, appetite change and fatigue.  HENT: Negative for mouth sores, sore throat and trouble swallowing.   Eyes: Negative for visual disturbance.  Respiratory: Negative for cough, chest tightness, shortness of breath and wheezing.   Cardiovascular: Negative for chest pain.  Gastrointestinal: Positive for nausea. Negative for vomiting, abdominal pain, diarrhea and abdominal distention.  Endocrine: Negative for polydipsia, polyphagia and polyuria.  Genitourinary: Negative for dysuria, frequency and hematuria.  Musculoskeletal: Negative for gait problem.  Skin: Negative for color change, pallor and rash.  Neurological: Positive for headaches. Negative for dizziness, syncope and light-headedness.  Hematological: Does not bruise/bleed easily.  Psychiatric/Behavioral: Negative for behavioral  problems and confusion.      Allergies  Labetalol  Home Medications   Prior to Admission medications   Medication Sig Start Date End Date Taking? Authorizing Provider  carvedilol (COREG) 12.5 MG tablet Take 12.5 mg by mouth 2 (two) times daily with a meal.    Historical Provider, MD  cyclobenzaprine (FLEXERIL) 10 MG tablet Take 10 mg by mouth 3 (three) times daily as needed.    Historical Provider, MD  eletriptan (RELPAX) 40 MG tablet One tablet by mouth at onset of headache. May repeat in 2 hours if headache persists or recurs. may repeat in 2 hours if necessary    Historical Provider, MD  hydrochlorothiazide (HYDRODIURIL) 25 MG tablet Take 25 mg by mouth daily.    Historical Provider, MD  hydrocortisone cream 1 % Apply to affected area 2 times daily 01/03/14   Monte FantasiaJoseph W Mintz, PA-C  ibuprofen (ADVIL,MOTRIN) 800 MG tablet Take 1 tablet (800 mg total) by mouth 3 (three) times daily. 01/03/14   Monte FantasiaJoseph W Mintz, PA-C  lamoTRIgine (LAMICTAL) 100 MG tablet Take 100 mg by mouth 2 (two) times daily.    Historical Provider, MD  oxyCODONE (ROXICODONE) 5 MG immediate release tablet Take 1 tablet (5 mg total) by mouth every 4 (four) hours as needed for severe pain. 04/07/14   Rolland PorterMark Elona Yinger, MD  promethazine (PHENERGAN) 25 MG tablet Take 25 mg by mouth every 6 (six) hours as needed.    Historical Provider, MD  sertraline (ZOLOFT) 100 MG tablet Take 100 mg by mouth daily.    Historical Provider, MD   BP 129/76 mmHg  Pulse 72  Temp(Src) 97.7 F (36.5 C) (Oral)  Resp 18  Ht 5\' 5"  (1.651 m)  Wt 209 lb (94.802 kg)  BMI 34.78 kg/m2  SpO2 97% Physical Exam  Constitutional: She is oriented to person, place, and time. She appears well-developed and well-nourished. No distress.  HENT:  Head: Normocephalic.    Eyes: Conjunctivae are normal. Pupils are equal, round, and reactive to light. No scleral icterus.  Neck: Normal range of motion. Neck supple. No thyromegaly present.  Cardiovascular: Normal rate  and regular rhythm.  Exam reveals no gallop and no friction rub.   No murmur heard. Pulmonary/Chest: Effort normal and breath sounds normal. No respiratory distress. She has no wheezes. She has no rales.  Abdominal: Soft. Bowel sounds are normal. She exhibits no distension. There is no tenderness. There is no rebound.  Musculoskeletal: Normal range of motion.  Neurological: She is alert and oriented to person, place, and time.  Skin: Skin is warm and dry. No rash noted.  Psychiatric: She has a normal mood and affect. Her behavior is normal.    ED Course  Procedures (including critical care time) Labs Review Labs Reviewed - No data to display  Imaging Review No results found.   EKG Interpretation None      MDM   Final diagnoses:  Other migraine without status migrainosus, not intractable    Patient relief of symptoms after IV medication. Appropriate for discharge home.    Rolland PorterMark Carleena Mires, MD 04/07/14 [phone]

## 2014-04-07 NOTE — ED Notes (Signed)
Pt given warm blanket.

## 2014-04-07 NOTE — ED Notes (Signed)
MD at bedside. 

## 2014-04-07 NOTE — ED Notes (Signed)
Patient here with complaint of migraine headache x 1 day. Nausea with same.

## 2014-04-07 NOTE — Discharge Instructions (Signed)

## 2014-05-31 ENCOUNTER — Emergency Department (HOSPITAL_BASED_OUTPATIENT_CLINIC_OR_DEPARTMENT_OTHER)
Admission: EM | Admit: 2014-05-31 | Discharge: 2014-05-31 | Disposition: A | Discharge status: Home / Self Care | Payer: PRIVATE HEALTH INSURANCE | Attending: Emergency Medicine | Admitting: Emergency Medicine

## 2014-05-31 ENCOUNTER — Encounter (HOSPITAL_BASED_OUTPATIENT_CLINIC_OR_DEPARTMENT_OTHER): Payer: Self-pay | Admitting: Emergency Medicine

## 2014-05-31 DIAGNOSIS — Z79899 Other long term (current) drug therapy: Secondary | ICD-10-CM | POA: Insufficient documentation

## 2014-05-31 DIAGNOSIS — Z87442 Personal history of urinary calculi: Secondary | ICD-10-CM | POA: Insufficient documentation

## 2014-05-31 DIAGNOSIS — F329 Major depressive disorder, single episode, unspecified: Secondary | ICD-10-CM | POA: Insufficient documentation

## 2014-05-31 DIAGNOSIS — G43909 Migraine, unspecified, not intractable, without status migrainosus: Secondary | ICD-10-CM

## 2014-05-31 DIAGNOSIS — Z720 Tobacco use: Secondary | ICD-10-CM | POA: Insufficient documentation

## 2014-05-31 DIAGNOSIS — G43C0 Periodic headache syndromes in child or adult, not intractable: Secondary | ICD-10-CM | POA: Insufficient documentation

## 2014-05-31 DIAGNOSIS — I10 Essential (primary) hypertension: Secondary | ICD-10-CM | POA: Insufficient documentation

## 2014-05-31 MED ORDER — KETOROLAC TROMETHAMINE 30 MG/ML IJ SOLN
30.0000 mg | Freq: Once | INTRAMUSCULAR | Status: AC
  Administered 2014-05-31: 30 mg via INTRAVENOUS
  Filled 2014-05-31: qty 1

## 2014-05-31 MED ORDER — SODIUM CHLORIDE 0.9 % IV BOLUS (SEPSIS)
1000.0000 mL | Freq: Once | INTRAVENOUS | Status: AC
  Administered 2014-05-31: 1000 mL via INTRAVENOUS

## 2014-05-31 MED ORDER — DEXAMETHASONE SODIUM PHOSPHATE 10 MG/ML IJ SOLN
10.0000 mg | Freq: Once | INTRAMUSCULAR | Status: AC
  Administered 2014-05-31: 10 mg via INTRAVENOUS
  Filled 2014-05-31: qty 1

## 2014-05-31 MED ORDER — DIPHENHYDRAMINE HCL 50 MG/ML IJ SOLN
25.0000 mg | Freq: Once | INTRAMUSCULAR | Status: AC
  Administered 2014-05-31: 25 mg via INTRAVENOUS
  Filled 2014-05-31: qty 1

## 2014-05-31 MED ORDER — METOCLOPRAMIDE HCL 5 MG/ML IJ SOLN
10.0000 mg | Freq: Once | INTRAMUSCULAR | Status: AC
  Administered 2014-05-31: 10 mg via INTRAVENOUS
  Filled 2014-05-31: qty 2

## 2014-05-31 NOTE — ED Notes (Signed)
Patient states that she has a Migraine starting at 3 pm today. Vomiting multiple times with the migraines

## 2014-05-31 NOTE — ED Provider Notes (Signed)
CSN: [id number]     Arrival date & time 05/31/14  1919 History  This chart was scribe for Rolan BuccoMelanie Ahlijah Raia, MD by Angelene GiovanniEmmanuella Mensah, ED Scribe. The patient was seen in room MH03/MH03 and the patient's care was started at 9:16 PM.    Chief Complaint  Patient presents with  . Headache   The history is provided by the patient. No language interpreter was used.   HPI Comments: Heather Gomez is a 49 y.o. female with a hx of frequent migraines who presents to the Emergency Department complaining of a severe frontal migraine. She reports associated photophobia and vomiting. She reports trying to take medication PTA but was unable to keep it down. She states her headache started earlier this afternoon and it's gradually gotten worse. Her headache feels the same as her past migraines type headache. She denies any fevers or neck pain. She denies any numbness or weakness in her extremities. She's tried Phenergan was unable to keep down.  Neurologist: Dr. Alphonsus SiasFreidman.  Past Medical History  Diagnosis Date  . Migraines   . Hypertension   . Depression   . Kidney stones    Past Surgical History  Procedure Laterality Date  . Ureteral stent placement    . Lithotripsy     History reviewed. No pertinent family history. History  Substance Use Topics  . Smoking status: Current Every Day Smoker -- 1.00 packs/day    Types: Cigarettes  . Smokeless tobacco: Not on file  . Alcohol Use: No   OB History    No data available     Review of Systems  Constitutional: Negative for fever, chills, diaphoresis and fatigue.  HENT: Negative for congestion, rhinorrhea and sneezing.   Eyes: Negative.   Respiratory: Negative for cough, chest tightness and shortness of breath.   Cardiovascular: Negative for chest pain and leg swelling.  Gastrointestinal: Positive for nausea and vomiting. Negative for abdominal pain, diarrhea and blood in stool.  Genitourinary: Negative for frequency, hematuria, flank pain and  difficulty urinating.  Musculoskeletal: Negative for back pain, arthralgias and neck pain.  Skin: Negative for rash.  Neurological: Positive for headaches. Negative for dizziness, speech difficulty, weakness and numbness.      Allergies  Labetalol  Home Medications   Prior to Admission medications   Medication Sig Start Date End Date Taking? Authorizing Provider  carvedilol (COREG) 12.5 MG tablet Take 12.5 mg by mouth 2 (two) times daily with a meal.    Historical Provider, MD  cyclobenzaprine (FLEXERIL) 10 MG tablet Take 10 mg by mouth 3 (three) times daily as needed.    Historical Provider, MD  eletriptan (RELPAX) 40 MG tablet One tablet by mouth at onset of headache. May repeat in 2 hours if headache persists or recurs. may repeat in 2 hours if necessary    Historical Provider, MD  hydrochlorothiazide (HYDRODIURIL) 25 MG tablet Take 25 mg by mouth daily.    Historical Provider, MD  hydrocortisone cream 1 % Apply to affected area 2 times daily 01/03/14   Monte FantasiaJoseph W Mintz, PA-C  ibuprofen (ADVIL,MOTRIN) 800 MG tablet Take 1 tablet (800 mg total) by mouth 3 (three) times daily. 01/03/14   Monte FantasiaJoseph W Mintz, PA-C  lamoTRIgine (LAMICTAL) 100 MG tablet Take 100 mg by mouth 2 (two) times daily.    Historical Provider, MD  oxyCODONE (ROXICODONE) 5 MG immediate release tablet Take 1 tablet (5 mg total) by mouth every 4 (four) hours as needed for severe pain. 04/07/14   Rolland PorterMark James, MD  promethazine (PHENERGAN) 25 MG tablet Take 25 mg by mouth every 6 (six) hours as needed.    Historical Provider, MD  sertraline (ZOLOFT) 100 MG tablet Take 100 mg by mouth daily.    Historical Provider, MD   BP 157/104 mmHg  Pulse 78  Temp(Src) 97.4 F (36.3 C) (Oral)  Resp 16  Wt 210 lb (95.255 kg)  SpO2 96% Physical Exam  Constitutional: She is oriented to person, place, and time. She appears well-developed and well-nourished.  HENT:  Head: Normocephalic and atraumatic.  Eyes: Pupils are equal, round, and  reactive to light.  Neck: Normal range of motion. Neck supple.  Cardiovascular: Normal rate, regular rhythm and normal heart sounds.   Pulmonary/Chest: Effort normal and breath sounds normal. No respiratory distress. She has no wheezes. She has no rales. She exhibits no tenderness.  Abdominal: Soft. Bowel sounds are normal. There is no tenderness. There is no rebound and no guarding.  Musculoskeletal: Normal range of motion. She exhibits no edema.  Lymphadenopathy:    She has no cervical adenopathy.  Neurological: She is alert and oriented to person, place, and time.  Motor 5 out of 5 all extremities. Sensation grossly intact to light touch all extremities. Cranial nerves II through XII grossly intact. No pronator drift. Finger-nose intact.  Skin: Skin is warm and dry. No rash noted.  Psychiatric: She has a normal mood and affect.    ED Course  Procedures (including critical care time) DIAGNOSTIC STUDIES: Oxygen Saturation is 98% on RA, normal by my interpretation.    COORDINATION OF CARE: 9:19 PM- Pt advised of plan for treatment and pt agrees.    Labs Review Labs Reviewed - No data to display Results for orders placed or performed during the hospital encounter of 12/19/13  Urine culture  Result Value Ref Range   Specimen Description URINE, CLEAN CATCH    Special Requests Normal    Culture  Setup Time      12/19/2013 15:25 Performed at [facility] Count      25,000 COLONIES/ML Performed at [facility]   Culture      Multiple bacterial morphotypes present, none predominant. Suggest appropriate recollection if clinically indicated. Performed at [facility]   Report Status 12/20/2013 FINAL   Urinalysis, Routine w reflex microscopic  Result Value Ref Range   Color, Urine YELLOW YELLOW   APPearance CLOUDY (A) CLEAR   Specific Gravity, Urine 1.014 1.005 - 1.030   pH 8.0 5.0 - 8.0   Glucose, UA NEGATIVE NEGATIVE mg/dL   Hgb urine dipstick  NEGATIVE NEGATIVE   Bilirubin Urine NEGATIVE NEGATIVE   Ketones, ur NEGATIVE NEGATIVE mg/dL   Protein, ur NEGATIVE NEGATIVE mg/dL   Urobilinogen, UA 0.2 0.0 - 1.0 mg/dL   Nitrite NEGATIVE NEGATIVE   Leukocytes, UA TRACE (A) NEGATIVE  CBC with Differential  Result Value Ref Range   WBC 8.2 4.0 - 10.5 K/uL   RBC 4.53 3.87 - 5.11 MIL/uL   Hemoglobin 15.3 (H) 12.0 - 15.0 g/dL   HCT 16.1 09.6 - 04.5 %   MCV 96.9 78.0 - 100.0 fL   MCH 33.8 26.0 - 34.0 pg   MCHC 34.9 30.0 - 36.0 g/dL   RDW 40.9 81.1 - 91.4 %   Platelets 200 150 - 400 K/uL   Neutrophils Relative % 60 43 - 77 %   Neutro Abs 5.0 1.7 - 7.7 K/uL   Lymphocytes Relative 30 12 - 46 %  Lymphs Abs 2.5 0.7 - 4.0 K/uL   Monocytes Relative 8 3 - 12 %   Monocytes Absolute 0.6 0.1 - 1.0 K/uL   Eosinophils Relative 1 0 - 5 %   Eosinophils Absolute 0.1 0.0 - 0.7 K/uL   Basophils Relative 1 0 - 1 %   Basophils Absolute 0.0 0.0 - 0.1 K/uL  Basic metabolic panel  Result Value Ref Range   Sodium 143 137 - 147 mEq/L   Potassium 3.9 3.7 - 5.3 mEq/L   Chloride 104 96 - 112 mEq/L   CO2 27 19 - 32 mEq/L   Glucose, Bld 98 70 - 99 mg/dL   BUN 12 6 - 23 mg/dL   Creatinine, Ser 1.61 0.50 - 1.10 mg/dL   Calcium 09.6 8.4 - 04.5 mg/dL   GFR calc non Af Amer 74 (L) >90 mL/min   GFR calc Af Amer 86 (L) >90 mL/min   Anion gap 12 5 - 15  Urine microscopic-add on  Result Value Ref Range   Squamous Epithelial / LPF FEW (A) RARE   WBC, UA 3-6 <3 WBC/hpf   RBC / HPF 0-2 <3 RBC/hpf   Bacteria, UA FEW (A) RARE   Urine-Other MUCOUS PRESENT    No results found.   Imaging Review No results found.   EKG Interpretation None      MDM   Final diagnoses:  Migraine syndrome   patient is given a migraine cocktail. She's feeling much better after this. Her headache is consistent with her past migraines type headaches. There is no unusual symptoms that would be more suggestive of subarachnoid hemorrhage or meningitis. She was discharged home in  good condition and encouraged to follow-up with her primary care physician or neurologist if her symptoms are not improving.  I personally performed the services described in this documentation, which was scribed in my presence.  The recorded information has been reviewed and considered.   Rolan Bucco, MD 05/31/14 [phone]

## 2014-05-31 NOTE — Discharge Instructions (Signed)

## 2014-07-20 ENCOUNTER — Encounter (HOSPITAL_BASED_OUTPATIENT_CLINIC_OR_DEPARTMENT_OTHER): Payer: Self-pay

## 2014-07-20 ENCOUNTER — Emergency Department (HOSPITAL_BASED_OUTPATIENT_CLINIC_OR_DEPARTMENT_OTHER)
Admission: EM | Admit: 2014-07-20 | Discharge: 2014-07-20 | Disposition: A | Discharge status: Home / Self Care | Payer: No Typology Code available for payment source | Attending: Emergency Medicine | Admitting: Emergency Medicine

## 2014-07-20 DIAGNOSIS — Z79899 Other long term (current) drug therapy: Secondary | ICD-10-CM | POA: Insufficient documentation

## 2014-07-20 DIAGNOSIS — R630 Anorexia: Secondary | ICD-10-CM | POA: Insufficient documentation

## 2014-07-20 DIAGNOSIS — Z791 Long term (current) use of non-steroidal anti-inflammatories (NSAID): Secondary | ICD-10-CM | POA: Diagnosis not present

## 2014-07-20 DIAGNOSIS — Z720 Tobacco use: Secondary | ICD-10-CM | POA: Insufficient documentation

## 2014-07-20 DIAGNOSIS — G43009 Migraine without aura, not intractable, without status migrainosus: Secondary | ICD-10-CM | POA: Insufficient documentation

## 2014-07-20 DIAGNOSIS — Z8659 Personal history of other mental and behavioral disorders: Secondary | ICD-10-CM | POA: Insufficient documentation

## 2014-07-20 DIAGNOSIS — Z87442 Personal history of urinary calculi: Secondary | ICD-10-CM | POA: Diagnosis not present

## 2014-07-20 DIAGNOSIS — G43909 Migraine, unspecified, not intractable, without status migrainosus: Secondary | ICD-10-CM | POA: Diagnosis present

## 2014-07-20 DIAGNOSIS — I10 Essential (primary) hypertension: Secondary | ICD-10-CM | POA: Diagnosis not present

## 2014-07-20 MED ORDER — METOCLOPRAMIDE HCL 10 MG PO TABS: 10.0000 mg | ORAL_TABLET | Freq: Four times a day (QID) | ORAL | Status: DC

## 2014-07-20 MED ORDER — SODIUM CHLORIDE 0.9 % IV BOLUS (SEPSIS)
500.0000 mL | Freq: Once | INTRAVENOUS | Status: AC
  Administered 2014-07-20: 500 mL via INTRAVENOUS

## 2014-07-20 MED ORDER — KETOROLAC TROMETHAMINE 15 MG/ML IJ SOLN
15.0000 mg | Freq: Once | INTRAMUSCULAR | Status: AC
  Administered 2014-07-20: 15 mg via INTRAVENOUS
  Filled 2014-07-20: qty 1

## 2014-07-20 MED ORDER — METOCLOPRAMIDE HCL 5 MG/ML IJ SOLN
10.0000 mg | Freq: Once | INTRAMUSCULAR | Status: AC
  Administered 2014-07-20: 10 mg via INTRAVENOUS
  Filled 2014-07-20: qty 2

## 2014-07-20 MED ORDER — DIPHENHYDRAMINE HCL 50 MG/ML IJ SOLN
50.0000 mg | Freq: Once | INTRAMUSCULAR | Status: AC
  Administered 2014-07-20: 50 mg via INTRAVENOUS
  Filled 2014-07-20: qty 1

## 2014-07-20 NOTE — ED Notes (Signed)
Pt reports hx of migraines, today presents with migraine x 1 week and "can't get rid of it".  Reported nausea.

## 2014-07-20 NOTE — ED Provider Notes (Signed)
CSN: [id number]     Arrival date & time 07/20/14  1336 History   First MD Initiated Contact with Patient 07/20/14 1410     Chief Complaint  Patient presents with  . Migraine     (Consider location/radiation/quality/duration/timing/severity/associated sxs/prior Treatment) HPI Comments: 49 year old female with smoking history, migraines, high blood pressure presents with gradual onset headache for the past week similar to previous migraines. Mild nausea no vision changes or unilateral neurologic symptoms. Nothing specifically is improved. Patient is tolerating oral fluids.  Patient is a 49 y.o. female presenting with migraines. The history is provided by the patient.  Migraine Associated symptoms include headaches. Pertinent negatives include no chest pain, no abdominal pain and no shortness of breath.    Past Medical History  Diagnosis Date  . Migraines   . Hypertension   . Depression   . Kidney stones    Past Surgical History  Procedure Laterality Date  . Ureteral stent placement    . Lithotripsy     No family history on file. History  Substance Use Topics  . Smoking status: Current Every Day Smoker -- 1.00 packs/day    Types: Cigarettes  . Smokeless tobacco: Not on file  . Alcohol Use: No   OB History    No data available     Review of Systems  Constitutional: Positive for appetite change. Negative for fever and chills.  HENT: Negative for congestion.   Eyes: Negative for visual disturbance.  Respiratory: Negative for shortness of breath.   Cardiovascular: Negative for chest pain.  Gastrointestinal: Negative for vomiting and abdominal pain.  Genitourinary: Negative for dysuria and flank pain.  Musculoskeletal: Negative for back pain, neck pain and neck stiffness.  Skin: Negative for rash.  Neurological: Positive for headaches. Negative for light-headedness.      Allergies  Labetalol  Home Medications   Prior to Admission medications   Medication Sig  Start Date End Date Taking? Authorizing Provider  carvedilol (COREG) 12.5 MG tablet Take 12.5 mg by mouth 2 (two) times daily with a meal.    Historical Provider, MD  cyclobenzaprine (FLEXERIL) 10 MG tablet Take 10 mg by mouth 3 (three) times daily as needed.    Historical Provider, MD  eletriptan (RELPAX) 40 MG tablet One tablet by mouth at onset of headache. May repeat in 2 hours if headache persists or recurs. may repeat in 2 hours if necessary    Historical Provider, MD  hydrochlorothiazide (HYDRODIURIL) 25 MG tablet Take 25 mg by mouth daily.    Historical Provider, MD  hydrocortisone cream 1 % Apply to affected area 2 times daily 01/03/14   Ladona Mow, PA-C  ibuprofen (ADVIL,MOTRIN) 800 MG tablet Take 1 tablet (800 mg total) by mouth 3 (three) times daily. 01/03/14   Ladona Mow, PA-C  lamoTRIgine (LAMICTAL) 100 MG tablet Take 100 mg by mouth 2 (two) times daily.    Historical Provider, MD  metoCLOPramide (REGLAN) 10 MG tablet Take 1 tablet (10 mg total) by mouth every 6 (six) hours. 07/20/14   Blane Ohara, MD  oxyCODONE (ROXICODONE) 5 MG immediate release tablet Take 1 tablet (5 mg total) by mouth every 4 (four) hours as needed for severe pain. 04/07/14   Rolland Porter, MD  promethazine (PHENERGAN) 25 MG tablet Take 25 mg by mouth every 6 (six) hours as needed.    Historical Provider, MD  sertraline (ZOLOFT) 100 MG tablet Take 100 mg by mouth daily.    Historical Provider, MD   BP 163/83 mmHg  Pulse 61  Temp(Src) 97.9 F (36.6 C) (Oral)  Resp 18  Ht 5\' 5"  (1.651 m)  Wt 209 lb (94.802 kg)  BMI 34.78 kg/m2  SpO2 97% Physical Exam  Constitutional: She is oriented to person, place, and time. She appears well-developed and well-nourished.  HENT:  Head: Normocephalic and atraumatic.  Eyes: Conjunctivae are normal. Right eye exhibits no discharge. Left eye exhibits no discharge.  Neck: Normal range of motion. Neck supple. No tracheal deviation present.  Cardiovascular: Normal rate and regular  rhythm.   Pulmonary/Chest: Effort normal and breath sounds normal.  Abdominal: Soft. She exhibits no distension. There is no tenderness. There is no guarding.  Musculoskeletal: She exhibits no edema.  Neurological: She is alert and oriented to person, place, and time. Coordination normal. GCS eye subscore is 4. GCS verbal subscore is 5. GCS motor subscore is 6.  5+ strength in UE and LE with f/e at major joints. Sensation to palpation intact in UE and LE. CNs 2-12 grossly intact.  EOMFI.  PERRL.   Finger nose and coordination intact bilateral.   Visual fields intact to finger testing.   Skin: Skin is warm. No rash noted.  Psychiatric: She has a normal mood and affect.  Nursing note and vitals reviewed.   ED Course  Procedures (including critical care time) Labs Review Labs Reviewed - No data to display  Imaging Review No results found.   EKG Interpretation None      MDM   Final diagnoses:  Migraine without aura and without status migrainosus, not intractable   Patient presents with headache similar to multiple previous. Patient has a normal neuro exam, gradual onset headache without signs of meningitis. Plan for headache cocktail fluids and close follow-up outpatient.  Results and differential diagnosis were discussed with the patient/parent/guardian. Close follow up outpatient was discussed, comfortable with the plan.   Medications  metoCLOPramide (REGLAN) injection 10 mg (10 mg Intravenous Given 07/20/14 1458)  sodium chloride 0.9 % bolus 500 mL (500 mLs Intravenous New Bag/Given 07/20/14 1456)  ketorolac (TORADOL) 15 MG/ML injection 15 mg (15 mg Intravenous Given 07/20/14 1501)  diphenhydrAMINE (BENADRYL) injection 50 mg (50 mg Intravenous Given 07/20/14 1457)    Filed Vitals:   07/20/14 1343 07/20/14 1353 07/20/14 1506  BP: 172/107 163/83   Pulse: 61    Temp: 97.9 F (36.6 C)  97.9 F (36.6 C)  TempSrc: Oral    Resp: 18    Height: 5\' 5"  (1.651 m)    Weight: 209  lb (94.802 kg)    SpO2: 97%      Final diagnoses:  Migraine without aura and without status migrainosus, not intractable       Blane OharaJoshua Meilah Delrosario, MD 07/20/14 1510

## 2014-07-20 NOTE — Discharge Instructions (Signed)
If you were given medicines take as directed.  If you are on coumadin or contraceptives realize their levels and effectiveness is altered by many different medicines.  If you have any reaction (rash, tongues swelling, other) to the medicines stop taking and see a physician. Take reglan with benadryl for migraines.    Please follow up as directed and return to the ER or see a physician for new or worsening symptoms.  Thank you. Filed Vitals:   07/20/14 1343 07/20/14 1353  BP: 172/107 163/83  Pulse: 61   Temp: 97.9 F (36.6 C)   TempSrc: Oral   Resp: 18   Height: 5\' 5"  (1.651 m)   Weight: 209 lb (94.802 kg)   SpO2: 97%

## 2014-07-21 ENCOUNTER — Emergency Department (HOSPITAL_BASED_OUTPATIENT_CLINIC_OR_DEPARTMENT_OTHER)
Admission: EM | Admit: 2014-07-21 | Discharge: 2014-07-22 | Disposition: A | Discharge status: Home / Self Care | Payer: No Typology Code available for payment source | Attending: Emergency Medicine | Admitting: Emergency Medicine

## 2014-07-21 ENCOUNTER — Encounter (HOSPITAL_BASED_OUTPATIENT_CLINIC_OR_DEPARTMENT_OTHER): Payer: Self-pay | Admitting: Emergency Medicine

## 2014-07-21 DIAGNOSIS — Z79899 Other long term (current) drug therapy: Secondary | ICD-10-CM | POA: Diagnosis not present

## 2014-07-21 DIAGNOSIS — F329 Major depressive disorder, single episode, unspecified: Secondary | ICD-10-CM | POA: Diagnosis not present

## 2014-07-21 DIAGNOSIS — Z87442 Personal history of urinary calculi: Secondary | ICD-10-CM | POA: Diagnosis not present

## 2014-07-21 DIAGNOSIS — R51 Headache: Secondary | ICD-10-CM | POA: Diagnosis present

## 2014-07-21 DIAGNOSIS — G43909 Migraine, unspecified, not intractable, without status migrainosus: Secondary | ICD-10-CM | POA: Diagnosis not present

## 2014-07-21 DIAGNOSIS — I10 Essential (primary) hypertension: Secondary | ICD-10-CM | POA: Diagnosis not present

## 2014-07-21 DIAGNOSIS — R519 Headache, unspecified: Secondary | ICD-10-CM

## 2014-07-21 DIAGNOSIS — Z792 Long term (current) use of antibiotics: Secondary | ICD-10-CM | POA: Insufficient documentation

## 2014-07-21 MED ORDER — METOCLOPRAMIDE HCL 5 MG/ML IJ SOLN
10.0000 mg | Freq: Once | INTRAMUSCULAR | Status: AC
  Administered 2014-07-21: 10 mg via INTRAVENOUS
  Filled 2014-07-21: qty 2

## 2014-07-21 MED ORDER — DIPHENHYDRAMINE HCL 50 MG/ML IJ SOLN
25.0000 mg | Freq: Once | INTRAMUSCULAR | Status: AC
  Administered 2014-07-21: 25 mg via INTRAVENOUS
  Filled 2014-07-21: qty 1

## 2014-07-21 MED ORDER — SODIUM CHLORIDE 0.9 % IV BOLUS (SEPSIS)
1000.0000 mL | Freq: Once | INTRAVENOUS | Status: AC
  Administered 2014-07-21: 1000 mL via INTRAVENOUS

## 2014-07-21 MED ORDER — KETOROLAC TROMETHAMINE 15 MG/ML IJ SOLN
15.0000 mg | Freq: Once | INTRAMUSCULAR | Status: AC
  Administered 2014-07-21: 15 mg via INTRAVENOUS
  Filled 2014-07-21: qty 1

## 2014-07-21 NOTE — ED Provider Notes (Signed)
CSN: [id number]     Arrival date & time 07/21/14  2244 History   This chart was scribed for Paula Libra, MD by Evon Slack, ED Scribe. This patient was seen in room MH03/MH03 and the patient's care was started at 11:28 PM.      Chief Complaint  Patient presents with  . Headache   HPI HPI Comments: NAVIKA HOOPES is a 49 y.o. female with a history of migraines. She has had an increased frequency of migraines over the past 3 weeks. She was previously well controlled on Lamictal. She was seen yesterday and treated for migraine. The headache recurred this evening at 2 pm. Pt states that her headache is located behind her left eye and radiates to the left side of her head. Abs it is throbbing and moderate to severe in intensity. It has not responded adequately to acetaminophen. Pt states she has associated photophobia and nausea. Pt denies vomiting. Her headache is different than her usual migraines which are usually bifrontal.  Past Medical History  Diagnosis Date  . Migraines   . Hypertension   . Depression   . Kidney stones    Past Surgical History  Procedure Laterality Date  . Ureteral stent placement    . Lithotripsy     History reviewed. No pertinent family history. History  Substance Use Topics  . Smoking status: Current Every Day Smoker -- 1.00 packs/day    Types: Cigarettes  . Smokeless tobacco: Not on file  . Alcohol Use: No   OB History    No data available     Review of Systems A complete 10 system review of systems was obtained and all systems are negative except as noted in the HPI and PMH.   Allergies  Labetalol  Home Medications   Prior to Admission medications   Medication Sig Start Date End Date Taking? Authorizing Provider  carvedilol (COREG) 12.5 MG tablet Take 12.5 mg by mouth 2 (two) times daily with a meal.    Historical Provider, MD  cyclobenzaprine (FLEXERIL) 10 MG tablet Take 10 mg by mouth 3 (three) times daily as needed.    Historical  Provider, MD  eletriptan (RELPAX) 40 MG tablet One tablet by mouth at onset of headache. May repeat in 2 hours if headache persists or recurs. may repeat in 2 hours if necessary    Historical Provider, MD  hydrochlorothiazide (HYDRODIURIL) 25 MG tablet Take 25 mg by mouth daily.    Historical Provider, MD  hydrocortisone cream 1 % Apply to affected area 2 times daily 01/03/14   Ladona Mow, PA-C  ibuprofen (ADVIL,MOTRIN) 800 MG tablet Take 1 tablet (800 mg total) by mouth 3 (three) times daily. 01/03/14   Ladona Mow, PA-C  lamoTRIgine (LAMICTAL) 100 MG tablet Take 100 mg by mouth 2 (two) times daily.    Historical Provider, MD  metoCLOPramide (REGLAN) 10 MG tablet Take 1 tablet (10 mg total) by mouth every 6 (six) hours. 07/20/14   Blane Ohara, MD  oxyCODONE (ROXICODONE) 5 MG immediate release tablet Take 1 tablet (5 mg total) by mouth every 4 (four) hours as needed for severe pain. 04/07/14   Rolland Porter, MD  promethazine (PHENERGAN) 25 MG tablet Take 25 mg by mouth every 6 (six) hours as needed.    Historical Provider, MD  sertraline (ZOLOFT) 100 MG tablet Take 100 mg by mouth daily.    Historical Provider, MD   BP 161/96 mmHg  Pulse 62  Temp(Src) 98.1 F (36.7 C) (Oral)  Ht 5\' 5"  (1.651 m)  Wt 209 lb (94.802 kg)  BMI 34.78 kg/m2  SpO2 98%   Physical Exam General: Well-developed, well-nourished female in no acute distress; appearance consistent with age of record HENT: normocephalic; atraumatic Eyes: pupils equal, round and reactive to light; extraocular muscles intact Neck: supple Heart: regular rate and rhythm Lungs: clear to auscultation bilaterally Abdomen: soft; nondistended; nontender; bowel sounds present Extremities: No deformity; full range of motion; pulses normal Neurologic: Awake, alert and oriented; motor function intact in all extremities and symmetric; no facial droop; normal coordination and speech; negative Romberg; normal finger to nose Skin: Warm and dry Psychiatric:  Normal mood and affect  ED Course  Procedures (including critical care time) DIAGNOSTIC STUDIES: Oxygen Saturation is 98% on RA, normal by my interpretation.    COORDINATION OF CARE: 11:35 PM-Discussed treatment plan with pt at bedside and pt agreed to plan.      MDM  12:29 AM Neck abated after IV medications and oxygen 100% by nonrebreather. The latter suggests there is an element of cluster headache as her headache is more consistent with cluster headache and different than her usual migraines which usually bifrontal.    Paula LibraJohn Darwin Guastella, MD 07/22/14 [phone]

## 2014-07-21 NOTE — ED Notes (Signed)
Patient states that the headache went away yesterday it just has come back now.

## 2014-11-20 ENCOUNTER — Other Ambulatory Visit (HOSPITAL_BASED_OUTPATIENT_CLINIC_OR_DEPARTMENT_OTHER): Payer: Self-pay | Admitting: Family Medicine

## 2014-11-20 DIAGNOSIS — R109 Unspecified abdominal pain: Secondary | ICD-10-CM

## 2014-11-22 ENCOUNTER — Ambulatory Visit (HOSPITAL_BASED_OUTPATIENT_CLINIC_OR_DEPARTMENT_OTHER): Payer: No Typology Code available for payment source

## 2014-11-23 ENCOUNTER — Emergency Department (HOSPITAL_BASED_OUTPATIENT_CLINIC_OR_DEPARTMENT_OTHER)
Admission: EM | Admit: 2014-11-23 | Discharge: 2014-11-24 | Disposition: A | Discharge status: Home / Self Care | Payer: No Typology Code available for payment source | Attending: Emergency Medicine | Admitting: Emergency Medicine

## 2014-11-23 ENCOUNTER — Encounter (HOSPITAL_BASED_OUTPATIENT_CLINIC_OR_DEPARTMENT_OTHER): Payer: Self-pay | Admitting: *Deleted

## 2014-11-23 DIAGNOSIS — Z79899 Other long term (current) drug therapy: Secondary | ICD-10-CM | POA: Insufficient documentation

## 2014-11-23 DIAGNOSIS — I10 Essential (primary) hypertension: Secondary | ICD-10-CM | POA: Insufficient documentation

## 2014-11-23 DIAGNOSIS — G43909 Migraine, unspecified, not intractable, without status migrainosus: Secondary | ICD-10-CM | POA: Diagnosis present

## 2014-11-23 DIAGNOSIS — Z720 Tobacco use: Secondary | ICD-10-CM | POA: Insufficient documentation

## 2014-11-23 DIAGNOSIS — Z87442 Personal history of urinary calculi: Secondary | ICD-10-CM | POA: Insufficient documentation

## 2014-11-23 DIAGNOSIS — F329 Major depressive disorder, single episode, unspecified: Secondary | ICD-10-CM | POA: Insufficient documentation

## 2014-11-23 DIAGNOSIS — G43901 Migraine, unspecified, not intractable, with status migrainosus: Secondary | ICD-10-CM | POA: Insufficient documentation

## 2014-11-23 DIAGNOSIS — G43801 Other migraine, not intractable, with status migrainosus: Secondary | ICD-10-CM

## 2014-11-23 DIAGNOSIS — Z791 Long term (current) use of non-steroidal anti-inflammatories (NSAID): Secondary | ICD-10-CM | POA: Diagnosis not present

## 2014-11-23 NOTE — ED Notes (Signed)
Pt here for migraine.  Pt was treated by her MD yesterday with toradol and the migraine resolved but came back this evening.  Neck supple and sensitivity to light and sound.

## 2014-11-24 ENCOUNTER — Ambulatory Visit (HOSPITAL_BASED_OUTPATIENT_CLINIC_OR_DEPARTMENT_OTHER)
Admission: RE | Admit: 2014-11-24 | Discharge: 2014-11-24 | Disposition: A | Discharge status: Home / Self Care | Payer: No Typology Code available for payment source | Source: Ambulatory Visit | Attending: Family Medicine | Admitting: Family Medicine

## 2014-11-24 DIAGNOSIS — R109 Unspecified abdominal pain: Secondary | ICD-10-CM

## 2014-11-24 DIAGNOSIS — N29 Other disorders of kidney and ureter in diseases classified elsewhere: Secondary | ICD-10-CM

## 2014-11-24 MED ORDER — PROCHLORPERAZINE EDISYLATE 5 MG/ML IJ SOLN
10.0000 mg | Freq: Four times a day (QID) | INTRAMUSCULAR | Status: DC | PRN
Start: 2014-11-24 — End: 2014-11-24
  Administered 2014-11-24: 10 mg via INTRAVENOUS
  Filled 2014-11-24: qty 2

## 2014-11-24 MED ORDER — DIPHENHYDRAMINE HCL 50 MG/ML IJ SOLN
25.0000 mg | Freq: Once | INTRAMUSCULAR | Status: AC
Start: 2014-11-24 — End: 2014-11-24
  Administered 2014-11-24: 25 mg via INTRAVENOUS
  Filled 2014-11-24: qty 1

## 2014-11-24 MED ORDER — KETOROLAC TROMETHAMINE 30 MG/ML IJ SOLN
30.0000 mg | Freq: Once | INTRAMUSCULAR | Status: AC
  Administered 2014-11-24: 30 mg via INTRAVENOUS
  Filled 2014-11-24: qty 1

## 2014-11-24 MED ORDER — SODIUM CHLORIDE 0.9 % IV BOLUS (SEPSIS)
1000.0000 mL | Freq: Once | INTRAVENOUS | Status: AC
  Administered 2014-11-24: 1000 mL via INTRAVENOUS

## 2014-11-24 NOTE — ED Notes (Signed)
Pt states, "feel better, want to go home", family at [facility], IV d/c'd, EDP notified.

## 2014-11-24 NOTE — Discharge Instructions (Signed)

## 2014-11-24 NOTE — ED Provider Notes (Signed)
CSN: [id number]     Arrival date & time 11/23/14  2301 History  This chart was scribed for Shon Baton, MD by Budd Palmer, ED Scribe. This patient was seen in room MH10/MH10 and the patient's care was started at 12:52 AM.    Chief Complaint  Patient presents with  . Migraine   The history is provided by the patient. No language interpreter was used.   HPI Comments: Heather Gomez is a 49 y.o. female with a PMHx of migraines who presents to the Emergency Department complaining of a migraine onset 1 day ago. She currently rates her pain as 7/10. Pt reports associated photophobia. She states that this feels like her usual migraines. She typically takes ibuprofen for migraines, and Flexaril when this does not help. She has tried both, as well as receiving a shot of Toradol at the ED yesterday, but still has had no relief. She takes HCTZ, Lamictal, and Phenergan daily. Pt denies fever or vomiting.  Past Medical History  Diagnosis Date  . Migraines   . Hypertension   . Depression   . Kidney stones    Past Surgical History  Procedure Laterality Date  . Ureteral stent placement    . Lithotripsy     No family history on file. History  Substance Use Topics  . Smoking status: Current Every Day Smoker -- 1.00 packs/day    Types: Cigarettes  . Smokeless tobacco: Not on file  . Alcohol Use: No   OB History    No data available     Review of Systems  Constitutional: Negative for fever.  Eyes: Positive for photophobia. Negative for visual disturbance.  Respiratory: Negative for cough, chest tightness and shortness of breath.   Cardiovascular: Negative for chest pain.  Gastrointestinal: Negative for nausea, vomiting and abdominal pain.  Genitourinary: Negative for dysuria.  Musculoskeletal: Negative for neck pain.  Neurological: Positive for headaches. Negative for weakness.  All other systems reviewed and are negative.   Allergies  Labetalol  Home Medications    Prior to Admission medications   Medication Sig Start Date End Date Taking? Authorizing Provider  carvedilol (COREG) 12.5 MG tablet Take 12.5 mg by mouth 2 (two) times daily with a meal.    Historical Provider, MD  cyclobenzaprine (FLEXERIL) 10 MG tablet Take 10 mg by mouth 3 (three) times daily as needed.    Historical Provider, MD  eletriptan (RELPAX) 40 MG tablet One tablet by mouth at onset of headache. May repeat in 2 hours if headache persists or recurs. may repeat in 2 hours if necessary    Historical Provider, MD  hydrochlorothiazide (HYDRODIURIL) 25 MG tablet Take 25 mg by mouth daily.    Historical Provider, MD  hydrocortisone cream 1 % Apply to affected area 2 times daily 01/03/14   Ladona Mow, PA-C  ibuprofen (ADVIL,MOTRIN) 800 MG tablet Take 1 tablet (800 mg total) by mouth 3 (three) times daily. 01/03/14   Ladona Mow, PA-C  lamoTRIgine (LAMICTAL) 100 MG tablet Take 100 mg by mouth 2 (two) times daily.    Historical Provider, MD  metoCLOPramide (REGLAN) 10 MG tablet Take 1 tablet (10 mg total) by mouth every 6 (six) hours. 07/20/14   Blane Ohara, MD  oxyCODONE (ROXICODONE) 5 MG immediate release tablet Take 1 tablet (5 mg total) by mouth every 4 (four) hours as needed for severe pain. 04/07/14   Rolland Porter, MD  promethazine (PHENERGAN) 25 MG tablet Take 25 mg by mouth every 6 (six) hours  as needed.    Historical Provider, MD  sertraline (ZOLOFT) 100 MG tablet Take 100 mg by mouth daily.    Historical Provider, MD   BP 148/74 mmHg  Pulse 63  Temp(Src) 98.7 F (37.1 C) (Oral)  Resp 20  Ht  (1.651 m)  Wt 202 lb (91.627 kg)  BMI 33.61 kg/m2  SpO2 98% Physical Exam  Constitutional: She is oriented to person, place, and time. She appears well-developed and well-nourished.  HENT:  Head: Normocephalic and atraumatic.  Eyes: Pupils are equal, round, and reactive to light.  Neck: Normal range of motion. Neck supple.  Cardiovascular: Normal rate, regular rhythm and normal heart  sounds.   No murmur heard. Pulmonary/Chest: Effort normal and breath sounds normal. No respiratory distress. She has no wheezes.  Musculoskeletal: She exhibits no edema.  Neurological: She is alert and oriented to person, place, and time. No cranial nerve deficit.  5 out of 5 strength in all 4 extremities  Skin: Skin is warm and dry.  Psychiatric: She has a normal mood and affect.  Nursing note and vitals reviewed.   ED Course  Procedures  DIAGNOSTIC STUDIES: Oxygen Saturation is 97% on RA, adequate by my interpretation.    COORDINATION OF CARE: 12:55 AM - Discussed plans to order a migraine cocktail. Pt advised of plan for treatment and pt agrees.  Labs Review Labs Reviewed - No data to display  Imaging Review No results found.   EKG Interpretation None      MDM   Final diagnoses:  Other migraine with status migrainosus, not intractable    Patient presents with symptoms consistent with her typical migraine. Nontoxic on exam. Nonfocal.  She given migraine cocktail. Following migraine cocktail, patient reported to nursing resolution of her symptoms.  After history, exam, and medical workup I feel the patient has been appropriately medically screened and is safe for discharge home. Pertinent diagnoses were discussed with the patient. Patient was given return precautions.  I personally performed the services described in this documentation, which was scribed in my presence. The recorded information has been reviewed and is accurate.   Shon Baton, MD 11/24/14 [phone]

## 2015-06-20 ENCOUNTER — Emergency Department (HOSPITAL_BASED_OUTPATIENT_CLINIC_OR_DEPARTMENT_OTHER): Payer: BLUE CROSS/BLUE SHIELD

## 2015-06-20 ENCOUNTER — Emergency Department (HOSPITAL_BASED_OUTPATIENT_CLINIC_OR_DEPARTMENT_OTHER)
Admission: EM | Admit: 2015-06-20 | Discharge: 2015-06-20 | Disposition: A | Discharge status: Home / Self Care | Payer: BLUE CROSS/BLUE SHIELD | Attending: Emergency Medicine | Admitting: Emergency Medicine

## 2015-06-20 ENCOUNTER — Encounter (HOSPITAL_BASED_OUTPATIENT_CLINIC_OR_DEPARTMENT_OTHER): Payer: Self-pay | Admitting: *Deleted

## 2015-06-20 DIAGNOSIS — F329 Major depressive disorder, single episode, unspecified: Secondary | ICD-10-CM | POA: Diagnosis not present

## 2015-06-20 DIAGNOSIS — Z79899 Other long term (current) drug therapy: Secondary | ICD-10-CM | POA: Insufficient documentation

## 2015-06-20 DIAGNOSIS — F17210 Nicotine dependence, cigarettes, uncomplicated: Secondary | ICD-10-CM | POA: Insufficient documentation

## 2015-06-20 DIAGNOSIS — Z7952 Long term (current) use of systemic steroids: Secondary | ICD-10-CM | POA: Insufficient documentation

## 2015-06-20 DIAGNOSIS — Y9389 Activity, other specified: Secondary | ICD-10-CM | POA: Insufficient documentation

## 2015-06-20 DIAGNOSIS — G43909 Migraine, unspecified, not intractable, without status migrainosus: Secondary | ICD-10-CM | POA: Diagnosis not present

## 2015-06-20 DIAGNOSIS — Z87442 Personal history of urinary calculi: Secondary | ICD-10-CM | POA: Diagnosis not present

## 2015-06-20 DIAGNOSIS — Y92410 Unspecified street and highway as the place of occurrence of the external cause: Secondary | ICD-10-CM | POA: Diagnosis not present

## 2015-06-20 DIAGNOSIS — I10 Essential (primary) hypertension: Secondary | ICD-10-CM | POA: Diagnosis not present

## 2015-06-20 DIAGNOSIS — Y998 Other external cause status: Secondary | ICD-10-CM | POA: Diagnosis not present

## 2015-06-20 DIAGNOSIS — S299XXA Unspecified injury of thorax, initial encounter: Secondary | ICD-10-CM | POA: Diagnosis present

## 2015-06-20 DIAGNOSIS — M549 Dorsalgia, unspecified: Secondary | ICD-10-CM

## 2015-06-20 MED ORDER — KETOROLAC TROMETHAMINE 60 MG/2ML IM SOLN
60.0000 mg | Freq: Once | INTRAMUSCULAR | Status: AC
  Administered 2015-06-20: 60 mg via INTRAMUSCULAR
  Filled 2015-06-20: qty 2
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
## 2016-07-05 DIAGNOSIS — K219 Gastro-esophageal reflux disease without esophagitis: Secondary | ICD-10-CM | POA: Diagnosis not present

## 2016-07-05 DIAGNOSIS — I10 Essential (primary) hypertension: Secondary | ICD-10-CM | POA: Diagnosis not present

## 2016-07-05 DIAGNOSIS — G43909 Migraine, unspecified, not intractable, without status migrainosus: Secondary | ICD-10-CM | POA: Diagnosis not present

## 2016-07-05 DIAGNOSIS — F324 Major depressive disorder, single episode, in partial remission: Secondary | ICD-10-CM | POA: Diagnosis not present

## 2016-10-04 DIAGNOSIS — J019 Acute sinusitis, unspecified: Secondary | ICD-10-CM | POA: Diagnosis not present

## 2016-10-04 DIAGNOSIS — J209 Acute bronchitis, unspecified: Secondary | ICD-10-CM | POA: Diagnosis not present

## 2016-12-19 ENCOUNTER — Emergency Department (HOSPITAL_BASED_OUTPATIENT_CLINIC_OR_DEPARTMENT_OTHER)
Admission: EM | Admit: 2016-12-19 | Discharge: 2016-12-20 | Disposition: A | Discharge status: Home / Self Care | Payer: BLUE CROSS/BLUE SHIELD | Attending: Emergency Medicine | Admitting: Emergency Medicine

## 2016-12-19 ENCOUNTER — Encounter (HOSPITAL_BASED_OUTPATIENT_CLINIC_OR_DEPARTMENT_OTHER): Payer: Self-pay | Admitting: Emergency Medicine

## 2016-12-19 DIAGNOSIS — Z79899 Other long term (current) drug therapy: Secondary | ICD-10-CM | POA: Insufficient documentation

## 2016-12-19 DIAGNOSIS — G43409 Hemiplegic migraine, not intractable, without status migrainosus: Secondary | ICD-10-CM | POA: Diagnosis not present

## 2016-12-19 DIAGNOSIS — I10 Essential (primary) hypertension: Secondary | ICD-10-CM | POA: Insufficient documentation

## 2016-12-19 DIAGNOSIS — Z960 Presence of urogenital implants: Secondary | ICD-10-CM | POA: Insufficient documentation

## 2016-12-19 DIAGNOSIS — R110 Nausea: Secondary | ICD-10-CM | POA: Diagnosis not present

## 2016-12-19 DIAGNOSIS — F17210 Nicotine dependence, cigarettes, uncomplicated: Secondary | ICD-10-CM | POA: Insufficient documentation

## 2016-12-19 DIAGNOSIS — H53149 Visual discomfort, unspecified: Secondary | ICD-10-CM | POA: Diagnosis not present

## 2016-12-19 MED ORDER — SODIUM CHLORIDE 0.9 % IV BOLUS (SEPSIS)
1000.0000 mL | Freq: Once | INTRAVENOUS | Status: AC
  Administered 2016-12-19: 1000 mL via INTRAVENOUS

## 2016-12-19 MED ORDER — DIPHENHYDRAMINE HCL 50 MG/ML IJ SOLN
25.0000 mg | Freq: Once | INTRAMUSCULAR | Status: AC
  Administered 2016-12-19: 25 mg via INTRAVENOUS
  Filled 2016-12-19: qty 1

## 2016-12-19 MED ORDER — METOCLOPRAMIDE HCL 5 MG/ML IJ SOLN
10.0000 mg | Freq: Once | INTRAMUSCULAR | Status: AC
  Administered 2016-12-19: 10 mg via INTRAVENOUS
  Filled 2016-12-19: qty 2

## 2016-12-19 MED ORDER — KETOROLAC TROMETHAMINE 15 MG/ML IJ SOLN
15.0000 mg | Freq: Once | INTRAMUSCULAR | Status: AC
  Administered 2016-12-19: 15 mg via INTRAVENOUS
  Filled 2016-12-19: qty 1

## 2016-12-19 NOTE — ED Triage Notes (Signed)
Patient states that she has had a HA since Friday. The patient reports that she has had N/V with light sensitivity as well

## 2016-12-19 NOTE — ED Provider Notes (Signed)
[facility] Provider Note: Lowella Dell, MD, FACEP  CSN: [id number] MRN: [id number] ARRIVAL: 12/19/16 at 2018 ROOM: MH04/MH04   CHIEF COMPLAINT  Headache   HISTORY OF PRESENT ILLNESS  12/19/16 11:15 PM Heather Gomez is a 51 y.o. female with a history of migraines. She is here with a headache she has had for about the past 48 hours. The onset was gradual and it has been waxing and waning. She currently rates her pain as a 7 out of 10. The pain is located frontally with radiation to the top of the head. It is characterized as like previous migraines. There is associated nausea but no vomiting. There is also associated photophobia. She denies focal neurologic deficits. She has taken Flexeril and Phenergan, which usually help her migraines, without relief.    Past Medical History:  Diagnosis Date  . Depression   . Hypertension   . Kidney stones   . Migraines     Past Surgical History:  Procedure Laterality Date  . LITHOTRIPSY    . URETERAL STENT PLACEMENT      History reviewed. No pertinent family history.  Social History  Substance Use Topics  . Smoking status: Current Every Day Smoker    Packs/day: 1.00    Types: Cigarettes  . Smokeless tobacco: Never Used  . Alcohol use No    Prior to Admission medications   Medication Sig Start Date End Date Taking? Authorizing Provider  carvedilol (COREG) 12.5 MG tablet Take 12.5 mg by mouth 2 (two) times daily with a meal.    [provider]  cyclobenzaprine (FLEXERIL) 10 MG tablet Take 10 mg by mouth 3 (three) times daily as needed.    [provider]  eletriptan (RELPAX) 40 MG tablet One tablet by mouth at onset of headache. May repeat in 2 hours if headache persists or recurs. may repeat in 2 hours if necessary    [provider]  hydrochlorothiazide (HYDRODIURIL) 25 MG tablet Take 25 mg by mouth daily.    [provider]  hydrocortisone cream 1 % Apply to affected area 2  times daily 01/03/14   Ladona Mow, PA-C  ibuprofen (ADVIL,MOTRIN) 800 MG tablet Take 1 tablet (800 mg total) by mouth 3 (three) times daily. 01/03/14   Ladona Mow, PA-C  lamoTRIgine (LAMICTAL) 100 MG tablet Take 100 mg by mouth 2 (two) times daily.    [provider]  metoCLOPramide (REGLAN) 10 MG tablet Take 1 tablet (10 mg total) by mouth every 6 (six) hours. 07/20/14   Blane Ohara, MD  oxyCODONE (ROXICODONE) 5 MG immediate release tablet Take 1 tablet (5 mg total) by mouth every 4 (four) hours as needed for severe pain. 04/07/14   Rolland Porter, MD  promethazine (PHENERGAN) 25 MG tablet Take 25 mg by mouth every 6 (six) hours as needed.    [provider]  sertraline (ZOLOFT) 100 MG tablet Take 100 mg by mouth daily.    [provider]    Allergies Labetalol and Propranolol   REVIEW OF SYSTEMS  Negative except as noted here or in the History of Present Illness.   PHYSICAL EXAMINATION  Initial Vital Signs Blood pressure (!) 155/78, pulse 69, temperature 98.1 F (36.7 C), temperature source Oral, resp. rate 18, height 5\' 5"  (1.651 m), weight 93.4 kg (206 lb), SpO2 100 %.  Examination General: Well-developed, well-nourished female in no acute distress; appearance consistent with age of record HENT: normocephalic; atraumatic Eyes: pupils equal, round and reactive to  light; extraocular muscles intact Neck: supple Heart: regular rate and rhythm Lungs: clear to auscultation bilaterally Abdomen: soft; nondistended; nontender; bowel sounds present Extremities: No deformity; full range of motion; pulses normal Neurologic: Awake, alert and oriented; motor function intact in all extremities and symmetric; no facial droop; normal coordination and speech Skin: Warm and dry Psychiatric: Normal mood and affect   RESULTS  Summary of this visit's results, reviewed by myself:   EKG Interpretation  Date/Time:    Ventricular Rate:    PR Interval:    QRS Duration:     QT Interval:    QTC Calculation:   R Axis:     Text Interpretation:        Laboratory Studies: No results found for this or any previous visit (from the past 24 hour(s)). Imaging Studies: No results found.  ED COURSE  Nursing notes and initial vitals signs, including pulse oximetry, reviewed.  Vitals:   12/19/16 2028 12/19/16 2031 12/19/16 2356 12/20/16 0055  BP:  (!) 155/78 (!) 142/90   Pulse:  69 78   Resp:  18 20 20   Temp:  98.1 F (36.7 C)    TempSrc:  Oral    SpO2:  100% 96%   Weight: 93.4 kg (206 lb)     Height: 5\' 5"  (1.651 m)      1:12 AM Patient feeling significantly better after IV fluids and medications.  PROCEDURES    ED DIAGNOSES     ICD-10-CM   1. Sporadic migraine G43.409        Cara Thaxton, MD 12/20/16 [phone]

## 2017-01-06 DIAGNOSIS — I10 Essential (primary) hypertension: Secondary | ICD-10-CM | POA: Diagnosis not present

## 2017-01-06 DIAGNOSIS — Z23 Encounter for immunization: Secondary | ICD-10-CM | POA: Diagnosis not present

## 2017-01-06 DIAGNOSIS — K219 Gastro-esophageal reflux disease without esophagitis: Secondary | ICD-10-CM | POA: Diagnosis not present

## 2017-01-06 DIAGNOSIS — G43909 Migraine, unspecified, not intractable, without status migrainosus: Secondary | ICD-10-CM | POA: Diagnosis not present

## 2017-01-06 DIAGNOSIS — F324 Major depressive disorder, single episode, in partial remission: Secondary | ICD-10-CM | POA: Diagnosis not present

## 2017-01-09 DIAGNOSIS — J019 Acute sinusitis, unspecified: Secondary | ICD-10-CM | POA: Diagnosis not present

## 2017-04-06 DIAGNOSIS — R51 Headache: Secondary | ICD-10-CM | POA: Diagnosis not present

## 2017-04-06 DIAGNOSIS — G43019 Migraine without aura, intractable, without status migrainosus: Secondary | ICD-10-CM | POA: Diagnosis not present

## 2017-04-06 DIAGNOSIS — G43719 Chronic migraine without aura, intractable, without status migrainosus: Secondary | ICD-10-CM | POA: Diagnosis not present

## 2017-05-10 ENCOUNTER — Encounter (HOSPITAL_BASED_OUTPATIENT_CLINIC_OR_DEPARTMENT_OTHER): Payer: Self-pay | Admitting: Emergency Medicine

## 2017-05-10 ENCOUNTER — Emergency Department (HOSPITAL_BASED_OUTPATIENT_CLINIC_OR_DEPARTMENT_OTHER): Payer: Self-pay

## 2017-05-10 ENCOUNTER — Emergency Department (HOSPITAL_BASED_OUTPATIENT_CLINIC_OR_DEPARTMENT_OTHER)
Admission: EM | Admit: 2017-05-10 | Discharge: 2017-05-10 | Disposition: A | Discharge status: Home / Self Care | Payer: Self-pay | Attending: Emergency Medicine | Admitting: Emergency Medicine

## 2017-05-10 ENCOUNTER — Other Ambulatory Visit: Payer: Self-pay

## 2017-05-10 DIAGNOSIS — I10 Essential (primary) hypertension: Secondary | ICD-10-CM | POA: Insufficient documentation

## 2017-05-10 DIAGNOSIS — Z79899 Other long term (current) drug therapy: Secondary | ICD-10-CM | POA: Insufficient documentation

## 2017-05-10 DIAGNOSIS — G43109 Migraine with aura, not intractable, without status migrainosus: Secondary | ICD-10-CM

## 2017-05-10 DIAGNOSIS — F17210 Nicotine dependence, cigarettes, uncomplicated: Secondary | ICD-10-CM | POA: Insufficient documentation

## 2017-05-10 DIAGNOSIS — G43119 Migraine with aura, intractable, without status migrainosus: Secondary | ICD-10-CM | POA: Insufficient documentation

## 2017-05-10 DIAGNOSIS — H1131 Conjunctival hemorrhage, right eye: Secondary | ICD-10-CM

## 2017-05-10 DIAGNOSIS — J189 Pneumonia, unspecified organism: Secondary | ICD-10-CM

## 2017-05-10 DIAGNOSIS — J181 Lobar pneumonia, unspecified organism: Secondary | ICD-10-CM

## 2017-05-10 MED ORDER — DIPHENHYDRAMINE HCL 50 MG/ML IJ SOLN
25.0000 mg | Freq: Once | INTRAMUSCULAR | Status: AC
  Administered 2017-05-10: 25 mg via INTRAVENOUS
  Filled 2017-05-10: qty 1

## 2017-05-10 MED ORDER — SODIUM CHLORIDE 0.9 % IV BOLUS (SEPSIS)
1000.0000 mL | Freq: Once | INTRAVENOUS | Status: AC
  Administered 2017-05-10: 1000 mL via INTRAVENOUS

## 2017-05-10 MED ORDER — ACETAMINOPHEN 325 MG PO TABS
650.0000 mg | ORAL_TABLET | Freq: Once | ORAL | Status: AC
  Administered 2017-05-10: 650 mg via ORAL
  Filled 2017-05-10: qty 2

## 2017-05-10 MED ORDER — PROCHLORPERAZINE EDISYLATE 5 MG/ML IJ SOLN
10.0000 mg | Freq: Once | INTRAMUSCULAR | Status: AC
  Administered 2017-05-10: 10 mg via INTRAVENOUS
  Filled 2017-05-10: qty 2

## 2017-05-10 MED ORDER — DOXYCYCLINE HYCLATE 100 MG PO CAPS: 100.0000 mg | ORAL_CAPSULE | Freq: Two times a day (BID) | ORAL | 0 refills | Status: DC

## 2017-05-10 MED ORDER — ERYTHROMYCIN 5 MG/GM OP OINT: TOPICAL_OINTMENT | OPHTHALMIC | 0 refills | Status: DC

## 2017-05-10 MED FILL — DOXYCYCLINE HYC 100 MG CAP: 100 | 7 days supply | Qty: 14 | Fill #0

## 2017-05-10 MED FILL — ERYTHROMYCIN EYE OINTMENT: 5 | 3 days supply | Qty: 4 | Fill #0

## 2017-05-10 NOTE — ED Notes (Signed)
Patient stated that she has been throwing up and stated that the redness might come from her throwing up.  Denies drainage or itching.

## 2017-05-10 NOTE — ED Provider Notes (Signed)
[facility] EMERGENCY DEPARTMENT Provider Note   CSN: [id number] Arrival date & time: 05/10/17  1022     History   Chief Complaint Chief Complaint  Patient presents with  . Eye redness, vomiting, migraine    HPI Heather Gomez is a 52 y.o. female.  HPI 52 year old Caucasian female past medical history significant for hypertension, tobacco use and migraines presents to the ED with multiple complaints.  First patient complains of URI symptoms with productive cough of yellow-green sputum.  She states this is been going on for the past 2-3 days.  She reports intermittent fevers.  Has been taking over-the-counter medications.  Denies any associated sore throat, chest pain or shortness of breath.  Patient states that she got into a coughing spell yesterday and had an episode of posttussive emesis.  Patient states that this caused her to develop a migraine.  She reports her headache started yesterday with gradual in onset.  Denies acute or maximal in onset.  Patient with history of migraines and states this feels very similar.  She describes the left side of her head that is sharp in nature.  Reports associated photophobia and nausea.  Denies any associated visual changes, lightheadedness or dizziness.  Patient denies any associated neck stiffness.  Patient states that she took ibuprofen for migraines with only little relief.  She states that when she gets a migraine like this she usually has to have medication from the ER.  Patient also complains of redness to her right eye.  She states that one week ago she started developing some itchiness in her right eye.  She states that yesterday after she vomited has been coughing for the past few days she knows that her right eye was very red and she started getting some bruising around the right eye.  Patient denies any assault.  She denies any vision changes or pain in her right eye.  Denies any purulent drainage.  She is not taking for this.   Denies any contact lens wearing.  Patient does not have an ophthalmologist.  Pt denies any fever, chill, vision changes, lightheadedness, dizziness, congestion, neck pain, cp, sob, abd pain, n/v/d, urinary symptoms, change in bowel habits, melena, hematochezia, lower extremity paresthesias.  Past Medical History:  Diagnosis Date  . Depression   . Hypertension   . Kidney stones   . Migraines     There are no active problems to display for this patient.   Past Surgical History:  Procedure Laterality Date  . LITHOTRIPSY    . URETERAL STENT PLACEMENT      OB History    No data available       Home Medications    Prior to Admission medications   Medication Sig Start Date End Date Taking? Authorizing Provider  carvedilol (COREG) 12.5 MG tablet Take 12.5 mg by mouth 2 (two) times daily with a meal.    [provider]  cyclobenzaprine (FLEXERIL) 10 MG tablet Take 10 mg by mouth 3 (three) times daily as needed.    [provider]  eletriptan (RELPAX) 40 MG tablet One tablet by mouth at onset of headache. May repeat in 2 hours if headache persists or recurs. may repeat in 2 hours if necessary    [provider]  hydrochlorothiazide (HYDRODIURIL) 25 MG tablet Take 25 mg by mouth daily.    [provider]  hydrocortisone cream 1 % Apply to affected area 2 times daily 01/03/14   Ladona Mow, PA-C  ibuprofen (  ADVIL,MOTRIN) 800 MG tablet Take 1 tablet (800 mg total) by mouth 3 (three) times daily. 01/03/14   Ladona Mow, PA-C  lamoTRIgine (LAMICTAL) 100 MG tablet Take 100 mg by mouth 2 (two) times daily.    [provider]  metoCLOPramide (REGLAN) 10 MG tablet Take 1 tablet (10 mg total) by mouth every 6 (six) hours. 07/20/14   Blane Ohara, MD  oxyCODONE (ROXICODONE) 5 MG immediate release tablet Take 1 tablet (5 mg total) by mouth every 4 (four) hours as needed for severe pain. 04/07/14   Rolland Porter, MD  promethazine (PHENERGAN) 25 MG tablet Take  25 mg by mouth every 6 (six) hours as needed.    [provider]  sertraline (ZOLOFT) 100 MG tablet Take 100 mg by mouth daily.    [provider]    Family History No family history on file.  Social History Social History   Tobacco Use  . Smoking status: Current Every Day Smoker    Packs/day: 1.00    Types: Cigarettes  . Smokeless tobacco: Never Used  Substance Use Topics  . Alcohol use: No  . Drug use: No     Allergies   Labetalol and Propranolol   Review of Systems Review of Systems  Constitutional: Positive for fever. Negative for chills.  HENT: Positive for congestion and sneezing. Negative for ear pain and sore throat.   Eyes: Positive for photophobia and redness. Negative for pain, discharge, itching and visual disturbance.  Respiratory: Positive for cough. Negative for shortness of breath and wheezing.   Cardiovascular: Negative for chest pain.  Gastrointestinal: Positive for nausea and vomiting. Negative for abdominal pain and diarrhea.  Genitourinary: Negative for dysuria, flank pain, frequency, hematuria and urgency.  Musculoskeletal: Negative for arthralgias and myalgias.  Skin: Negative for rash.  Neurological: Negative for dizziness, syncope, weakness, light-headedness, numbness and headaches.  Psychiatric/Behavioral: Negative for sleep disturbance. The patient is not nervous/anxious.      Physical Exam Updated Vital Signs BP (!) 145/91 (BP Location: Right Arm)   Pulse (!) 104   Temp 99.3 F (37.4 C) (Oral)   Resp 16   Ht 5\' 5"  (1.651 m)   Wt 93.4 kg (206 lb)   SpO2 100%   BMI 34.28 kg/m   Physical Exam  Constitutional: She is oriented to person, place, and time. She appears well-developed and well-nourished.  Non-toxic appearance. No distress.  HENT:  Head: Normocephalic and atraumatic.  Nose: Nose normal.  Mouth/Throat: Oropharynx is clear and moist.  Eyes: EOM and lids are normal. Pupils are equal, round, and reactive to  light. Lids are everted and swept, no foreign bodies found. Right eye exhibits chemosis. Right eye exhibits no discharge and no exudate. Left eye exhibits no chemosis, no discharge and no exudate. Right conjunctiva is injected. Right conjunctiva has a hemorrhage. Left conjunctiva is not injected. Left conjunctiva has no hemorrhage.  Severe conjunctival hemorrhage noted of the right eye with associated ecchymosis.  No photophobia or consensual photophobia.  EOMs are intact.  Bruising noted around the right orbit.     Visual Acuity  Right Eye Distance: 20/30 Left Eye Distance: 20/40 Bilateral Distance: 20/30  Right Eye Near:   Left Eye Near:    Bilateral Near:      Neck: Normal range of motion. Neck supple.  No c spine midline tenderness. No paraspinal tenderness. No deformities or step offs noted. Full ROM. Supple. No nuchal rigidity.    Cardiovascular: Normal rate, regular rhythm, normal heart sounds  and intact distal pulses. Exam reveals no gallop and no friction rub.  No murmur heard. Pulmonary/Chest: Effort normal. No accessory muscle usage or stridor. No tachypnea. No respiratory distress. She has no wheezes. She has rhonchi in the right upper field. She has no rales. She exhibits no tenderness.  Abdominal: Soft. Bowel sounds are normal. There is no tenderness. There is no rebound and no guarding.  Musculoskeletal: Normal range of motion. She exhibits no tenderness.  Lymphadenopathy:    She has no cervical adenopathy.  Neurological: She is alert and oriented to person, place, and time.  Skin: Skin is warm and dry. Capillary refill takes less than 2 seconds.  Psychiatric: Her behavior is normal. Judgment and thought content normal.  Nursing note and vitals reviewed.        ED Treatments / Results  Labs (all labs ordered are listed, but only abnormal results are displayed) Labs Reviewed - No data to display  EKG  EKG Interpretation None       Radiology No results  found.  Procedures Procedures (including critical care time)  Medications Ordered in ED Medications  sodium chloride 0.9 % bolus 1,000 mL (0 mLs Intravenous Stopped 05/10/17 1255)  prochlorperazine (COMPAZINE) injection 10 mg (10 mg Intravenous Given 05/10/17 1152)  diphenhydrAMINE (BENADRYL) injection 25 mg (25 mg Intravenous Given 05/10/17 1150)  acetaminophen (TYLENOL) tablet 650 mg (650 mg Oral Given 05/10/17 1343)     Initial Impression / Assessment and Plan / ED Course  I have reviewed the triage vital signs and the nursing notes.  Pertinent labs & imaging results that were available during my care of the patient were reviewed by me and considered in my medical decision making (see chart for details).     Patient presents to the ED with multiple complaints including URI symptoms with productive cough, migraine that is typical of her migraine headaches and right eye redness.  The patient states that her symptoms are stemming from vomiting and coughing.  Patient denies any visual changes or pain in her right eye.  Denies any contact lens wearing.  Patient is overall well-appearing and nontoxic.  Vital signs are reassuring.  Patient afebrile in the ED.  Visual acuity is unremarkable.  Lungs with some rhonchi in the right upper lobe.  Heart regular rate and rhythm.  No focal abdominal tenderness to palpation.  Eye exam reveals severe conjunctival hemorrhage with ecchymosis noted of the right eye.  There is no photophobia or consensual photophobia.  EOMs are intact.  She does have some mild ecchymosis around the orbit of the right eye. No visual signs  Patient states that her headache is like her typical migraines.  Denies any red flag symptoms that be concerning for [facility], [facility], meningitis.  Patient treated with migraine cocktail in the ED and has complete resolution in her headache.  Given that the patient has no eye pain, visual changes do not feel that further intervention is indicated.  This  appears to be a severe conjunctival hemorrhage from excessive for such as coughing or vomiting.  Have talked with Dr. Allena Katz with ophthalmology who has no further recommendations will see patient in the office this week.  Will start patient on erythromycin ointment for antibiotic prophylaxis.  Given patient's cough and sputum production with fevers and history of tobacco use x-rays performed.  Does note a possible right upper lobe infiltrate.  Will treat with doxycycline.  Pt is hemodynamically stable, in NAD, & able to ambulate in the ED. Evaluation  does not show pathology that would require ongoing emergent intervention or inpatient treatment. I explained the diagnosis to the patient. Pain has been managed & has no complaints prior to dc. Pt is comfortable with above plan and is stable for discharge at this time. All questions were answered prior to disposition. Strict return precautions for f/u to the ED were discussed. Encouraged follow up with PCP.  Pt seen and eval by my attending Dr. Clarene DukeLittle who is agreeable with the above plan.    Final Clinical Impressions(s) / ED Diagnoses   Final diagnoses:  Subconjunctival hemorrhage of right eye  Community acquired pneumonia of right upper lobe of lung (HCC)  Migraine aura without headache    ED Discharge Orders        Ordered    doxycycline (VIBRAMYCIN) 100 MG capsule  2 times daily     05/10/17 1400    erythromycin ophthalmic ointment     05/10/17 1400       Rise MuLeaphart, Kenneth T, PA-C 05/11/17 0911    Little, Ambrose Finlandachel Morgan, MD 05/11/17 1558

## 2017-05-10 NOTE — Discharge Instructions (Signed)
Your chest xray does show signs of pneumonia take the antibiotics as prescribed.  Follow-up with eye doctor have given you a referral.  Have given you an eye antibotic to use as well to prevent infection. Make sure you follow-up with your primary care doctor as well.  Return to the ED with any worsening symptoms including any new headaches, vision changes, worsening fevers, vomiting, visual changes.

## 2017-05-10 NOTE — ED Triage Notes (Signed)
Right eye reddened x4 days.  Migraine with accompanying vomiting (per her usual migraine) since yesterday.  Has not taken any pain med today.  Takes Ibuprofen for her "migraines".

## 2018-01-19 MED FILL — PROMETHAZINE 25 MG TABLET: 25 | 7 days supply | Qty: 30 | Fill #0

## 2018-01-19 MED FILL — HYDROCHLOROTHIAZIDE 25 MG T: 25 | 30 days supply | Qty: 30 | Fill #0

## 2018-01-19 MED FILL — CARVEDILOL 12.5 MG TABLET: 12.5 | 30 days supply | Qty: 60 | Fill #0

## 2018-01-19 MED FILL — CYCLOBENZAPRINE HCL 10 MG T: 10 | 30 days supply | Qty: 30 | Fill #0

## 2018-01-24 MED FILL — lamoTRIgine 100 MG TABS: 100 | 30 days supply | Qty: 120 | Fill #0

## 2018-03-09 ENCOUNTER — Encounter (HOSPITAL_BASED_OUTPATIENT_CLINIC_OR_DEPARTMENT_OTHER): Payer: Self-pay | Admitting: *Deleted

## 2018-03-09 ENCOUNTER — Emergency Department (HOSPITAL_BASED_OUTPATIENT_CLINIC_OR_DEPARTMENT_OTHER)
Admission: EM | Admit: 2018-03-09 | Discharge: 2018-03-09 | Disposition: A | Discharge status: Home / Self Care | Payer: Self-pay | Attending: Emergency Medicine | Admitting: Emergency Medicine

## 2018-03-09 ENCOUNTER — Other Ambulatory Visit: Payer: Self-pay

## 2018-03-09 DIAGNOSIS — I10 Essential (primary) hypertension: Secondary | ICD-10-CM | POA: Insufficient documentation

## 2018-03-09 DIAGNOSIS — F17210 Nicotine dependence, cigarettes, uncomplicated: Secondary | ICD-10-CM | POA: Insufficient documentation

## 2018-03-09 DIAGNOSIS — Z79899 Other long term (current) drug therapy: Secondary | ICD-10-CM | POA: Insufficient documentation

## 2018-03-09 DIAGNOSIS — G43809 Other migraine, not intractable, without status migrainosus: Secondary | ICD-10-CM | POA: Insufficient documentation

## 2018-03-09 MED ORDER — DIPHENHYDRAMINE HCL 50 MG/ML IJ SOLN
25.0000 mg | Freq: Once | INTRAMUSCULAR | Status: AC
  Administered 2018-03-09: 25 mg via INTRAVENOUS
  Filled 2018-03-09: qty 1

## 2018-03-09 MED ORDER — HALOPERIDOL LACTATE 5 MG/ML IJ SOLN
3.0000 mg | Freq: Once | INTRAMUSCULAR | Status: DC
  Filled 2018-03-09: qty 1

## 2018-03-09 MED ORDER — SODIUM CHLORIDE 0.9 % IV BOLUS
1000.0000 mL | Freq: Once | INTRAVENOUS | Status: AC
  Administered 2018-03-09: 1000 mL via INTRAVENOUS

## 2018-03-09 MED ORDER — PROCHLORPERAZINE EDISYLATE 10 MG/2ML IJ SOLN
10.0000 mg | Freq: Once | INTRAMUSCULAR | Status: AC
Start: 2018-03-09 — End: 2018-03-09
  Administered 2018-03-09: 10 mg via INTRAVENOUS
  Filled 2018-03-09: qty 2

## 2018-03-09 MED ORDER — DEXAMETHASONE SODIUM PHOSPHATE 10 MG/ML IJ SOLN
10.0000 mg | Freq: Once | INTRAMUSCULAR | Status: AC
  Administered 2018-03-09: 10 mg via INTRAVENOUS
  Filled 2018-03-09: qty 1

## 2018-03-09 MED ORDER — MAGNESIUM SULFATE 2 GM/50ML IV SOLN
2.0000 g | Freq: Once | INTRAVENOUS | Status: AC
  Administered 2018-03-09: 2 g via INTRAVENOUS
  Filled 2018-03-09: qty 50

## 2018-03-09 NOTE — ED Triage Notes (Signed)
Migraine headache since this am. She has vomited x 2. Hx of migraines.

## 2018-03-09 NOTE — ED Notes (Signed)
Took Pherergan, flexeril and 800mg  of ibuprofen between 12pm and 1300 with no relief. Headache started around 9 am today after going to work. Pt presents with nausea and vomited at approx 1900.

## 2018-03-09 NOTE — ED Provider Notes (Signed)
[facility] EMERGENCY DEPARTMENT Provider Note   CSN: [id number] Arrival date & time: 03/09/18  1906     History   Chief Complaint Chief Complaint  Patient presents with  . Migraine    HPI Heather Gomez is a 52 y.o. female.  HPI   Around 9 or 10 began to have headache, constant pain left side Vomiting started around 6PM, threw up twice Some of migraines start slowly and worsen, some start more suddenly, this one began more suddenly. 8/10 headache, throbbing. Feels like migraines. Lights and scents make it worse. Took ibuprofen, flexeril and phenergan at home without relief No trauma, no fevers, no numbness/weakness difficulty speaking or walking, no change in vision.   Past Medical History:  Diagnosis Date  . Depression   . Hypertension   . Kidney stones   . Migraines     There are no active problems to display for this patient.   Past Surgical History:  Procedure Laterality Date  . LITHOTRIPSY    . URETERAL STENT PLACEMENT       OB History   None      Home Medications    Prior to Admission medications   Medication Sig Start Date End Date Taking? Authorizing Provider  carvedilol (COREG) 12.5 MG tablet Take 12.5 mg by mouth 2 (two) times daily with a meal.   Yes [provider]  cyclobenzaprine (FLEXERIL) 10 MG tablet Take 10 mg by mouth 3 (three) times daily as needed.   Yes [provider]  hydrochlorothiazide (HYDRODIURIL) 25 MG tablet Take 25 mg by mouth daily.   Yes [provider]  lamoTRIgine (LAMICTAL) 100 MG tablet Take 100 mg by mouth 2 (two) times daily.   Yes [provider]  promethazine (PHENERGAN) 25 MG tablet Take 25 mg by mouth every 6 (six) hours as needed.   Yes [provider]  sertraline (ZOLOFT) 100 MG tablet Take 100 mg by mouth daily.   Yes [provider]  doxycycline (VIBRAMYCIN) 100 MG capsule Take 1 capsule (100 mg total) by mouth 2 (two) times daily.  05/10/17   Rise Mu, PA-C  eletriptan (RELPAX) 40 MG tablet One tablet by mouth at onset of headache. May repeat in 2 hours if headache persists or recurs. may repeat in 2 hours if necessary    [provider]  erythromycin ophthalmic ointment Place a 1/2 inch ribbon of ointment into the lower eyelid, 4 times per day for 4 days 05/10/17   Demetrios Loll T, PA-C  hydrocortisone cream 1 % Apply to affected area 2 times daily 01/03/14   Ladona Mow, PA-C  ibuprofen (ADVIL,MOTRIN) 800 MG tablet Take 1 tablet (800 mg total) by mouth 3 (three) times daily. 01/03/14   Ladona Mow, PA-C  metoCLOPramide (REGLAN) 10 MG tablet Take 1 tablet (10 mg total) by mouth every 6 (six) hours. 07/20/14   Blane Ohara, MD  oxyCODONE (ROXICODONE) 5 MG immediate release tablet Take 1 tablet (5 mg total) by mouth every 4 (four) hours as needed for severe pain. 04/07/14   Rolland Porter, MD    Family History No family history on file.  Social History Social History   Tobacco Use  . Smoking status: Current Every Day Smoker    Packs/day: 1.00    Types: Cigarettes  . Smokeless tobacco: Never Used  Substance Use Topics  . Alcohol use: No  . Drug use: No     Allergies   Labetalol and Propranolol  Review of Systems Review of Systems  Constitutional: Negative for fever.  HENT: Negative for congestion.   Respiratory: Negative for shortness of breath.   Cardiovascular: Negative for chest pain.  Gastrointestinal: Positive for nausea and vomiting. Negative for abdominal pain and diarrhea.  Genitourinary: Negative for difficulty urinating and dysuria.  Skin: Negative for rash.  Neurological: Positive for headaches. Negative for syncope, facial asymmetry, speech difficulty, weakness and numbness.     Physical Exam Updated Vital Signs BP 130/79   Pulse 75   Temp 98.3 F (36.8 C)   Resp 18   Ht 5\' 5"  (1.651 m)   Wt 90.7 kg   SpO2 95%   BMI 33.28 kg/m   Physical Exam  Constitutional:  She is oriented to person, place, and time. She appears well-developed and well-nourished. No distress.  HENT:  Head: Normocephalic and atraumatic.  Eyes: Conjunctivae and EOM are normal.  Neck: Normal range of motion.  Cardiovascular: Normal rate, regular rhythm, normal heart sounds and intact distal pulses. Exam reveals no gallop and no friction rub.  No murmur heard. Pulmonary/Chest: Effort normal and breath sounds normal. No respiratory distress. She has no wheezes. She has no rales.  Abdominal: Soft. She exhibits no distension. There is no tenderness. There is no guarding.  Musculoskeletal: She exhibits no edema or tenderness.  Neurological: She is alert and oriented to person, place, and time. She has normal strength. No cranial nerve deficit or sensory deficit. Coordination and gait normal. GCS eye subscore is 4. GCS verbal subscore is 5. GCS motor subscore is 6.  Skin: Skin is warm and dry. No rash noted. She is not diaphoretic. No erythema.  Nursing note and vitals reviewed.    ED Treatments / Results  Labs (all labs ordered are listed, but only abnormal results are displayed) Labs Reviewed - No data to display  EKG None  Radiology No results found.  Procedures Procedures (including critical care time)  Medications Ordered in ED Medications  prochlorperazine (COMPAZINE) injection 10 mg (10 mg Intravenous Given 03/09/18 2004)  diphenhydrAMINE (BENADRYL) injection 25 mg (25 mg Intravenous Given 03/09/18 2005)  sodium chloride 0.9 % bolus 1,000 mL ( Intravenous Stopped 03/09/18 2108)  dexamethasone (DECADRON) injection 10 mg (10 mg Intravenous Given 03/09/18 2004)  magnesium sulfate IVPB 2 g 50 mL ( Intravenous Stopped 03/09/18 2155)     Initial Impression / Assessment and Plan / ED Course  I have reviewed the triage vital signs and the nursing notes.  Pertinent labs & imaging results that were available during my care of the patient were reviewed by me and considered  in my medical decision making (see chart for details).     52yo female with history of migraines presents with concern of headache.  Headache similar to past migraines, not thunderclap worst headache, no trauma, no fevers, and normal neurologic exam and [facility], [facility] or meningitis.  Patient was given compazine, decadron and benadryl followed by Mg with improvement in headache.  Patient discharged in stable condition with understanding of reasons to return.    Final Clinical Impressions(s) / ED Diagnoses   Final diagnoses:  Other migraine without status migrainosus, not intractable    ED Discharge Orders    None       Alvira MondaySchlossman, Sharlyne Koeneman, MD 03/10/18 1049

## 2018-03-09 NOTE — ED Notes (Signed)
ED Provider at bedside. 

## 2018-03-16 ENCOUNTER — Emergency Department (HOSPITAL_BASED_OUTPATIENT_CLINIC_OR_DEPARTMENT_OTHER)
Admission: EM | Admit: 2018-03-16 | Discharge: 2018-03-16 | Disposition: A | Discharge status: Home / Self Care | Payer: No Typology Code available for payment source | Attending: Emergency Medicine | Admitting: Emergency Medicine

## 2018-03-16 ENCOUNTER — Emergency Department (HOSPITAL_BASED_OUTPATIENT_CLINIC_OR_DEPARTMENT_OTHER): Payer: No Typology Code available for payment source

## 2018-03-16 ENCOUNTER — Encounter (HOSPITAL_BASED_OUTPATIENT_CLINIC_OR_DEPARTMENT_OTHER): Payer: Self-pay | Admitting: Emergency Medicine

## 2018-03-16 ENCOUNTER — Other Ambulatory Visit: Payer: Self-pay

## 2018-03-16 DIAGNOSIS — F17210 Nicotine dependence, cigarettes, uncomplicated: Secondary | ICD-10-CM | POA: Insufficient documentation

## 2018-03-16 DIAGNOSIS — S161XXA Strain of muscle, fascia and tendon at neck level, initial encounter: Secondary | ICD-10-CM | POA: Insufficient documentation

## 2018-03-16 DIAGNOSIS — Z23 Encounter for immunization: Secondary | ICD-10-CM | POA: Diagnosis not present

## 2018-03-16 DIAGNOSIS — S022XXA Fracture of nasal bones, initial encounter for closed fracture: Secondary | ICD-10-CM | POA: Diagnosis not present

## 2018-03-16 DIAGNOSIS — Y999 Unspecified external cause status: Secondary | ICD-10-CM | POA: Diagnosis not present

## 2018-03-16 DIAGNOSIS — Z79899 Other long term (current) drug therapy: Secondary | ICD-10-CM | POA: Diagnosis not present

## 2018-03-16 DIAGNOSIS — Y939 Activity, unspecified: Secondary | ICD-10-CM | POA: Diagnosis not present

## 2018-03-16 DIAGNOSIS — Y92410 Unspecified street and highway as the place of occurrence of the external cause: Secondary | ICD-10-CM | POA: Insufficient documentation

## 2018-03-16 DIAGNOSIS — I10 Essential (primary) hypertension: Secondary | ICD-10-CM | POA: Insufficient documentation

## 2018-03-16 DIAGNOSIS — S199XXA Unspecified injury of neck, initial encounter: Secondary | ICD-10-CM | POA: Diagnosis present

## 2018-03-16 MED ORDER — TETANUS-DIPHTH-ACELL PERTUSSIS 5-2.5-18.5 LF-MCG/0.5 IM SUSP
0.5000 mL | Freq: Once | INTRAMUSCULAR | Status: AC
Start: 2018-03-16 — End: 2018-03-16
  Administered 2018-03-16: 0.5 mL via INTRAMUSCULAR
  Filled 2018-03-16: qty 0.5

## 2018-03-16 MED ORDER — OXYCODONE HCL 5 MG PO TABS: 5.0000 mg | ORAL_TABLET | Freq: Four times a day (QID) | ORAL | 0 refills | Status: DC | PRN

## 2018-03-16 MED ORDER — OXYCODONE-ACETAMINOPHEN 5-325 MG PO TABS
2.0000 | ORAL_TABLET | Freq: Once | ORAL | Status: AC
  Administered 2018-03-16: 2 via ORAL
  Filled 2018-03-16: qty 2

## 2018-03-16 MED FILL — lamoTRIgine 100 MG TABS: 100 | 30 days supply | Qty: 120 | Fill #1

## 2018-03-16 NOTE — ED Notes (Signed)
Patient transported to CT 
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
## 2018-03-17 MED FILL — oxyCODONE HCL 5 MG TABS: 5 | 3 days supply | Qty: 15 | Fill #0

## 2018-03-21 ENCOUNTER — Emergency Department (HOSPITAL_BASED_OUTPATIENT_CLINIC_OR_DEPARTMENT_OTHER): Payer: No Typology Code available for payment source

## 2018-03-21 ENCOUNTER — Emergency Department (HOSPITAL_BASED_OUTPATIENT_CLINIC_OR_DEPARTMENT_OTHER)
Admission: EM | Admit: 2018-03-21 | Discharge: 2018-03-21 | Disposition: A | Discharge status: Home / Self Care | Payer: No Typology Code available for payment source | Attending: Emergency Medicine | Admitting: Emergency Medicine

## 2018-03-21 ENCOUNTER — Other Ambulatory Visit: Payer: Self-pay

## 2018-03-21 ENCOUNTER — Encounter (HOSPITAL_BASED_OUTPATIENT_CLINIC_OR_DEPARTMENT_OTHER): Payer: Self-pay | Admitting: Emergency Medicine

## 2018-03-21 DIAGNOSIS — F17210 Nicotine dependence, cigarettes, uncomplicated: Secondary | ICD-10-CM | POA: Insufficient documentation

## 2018-03-21 DIAGNOSIS — R109 Unspecified abdominal pain: Secondary | ICD-10-CM | POA: Diagnosis present

## 2018-03-21 DIAGNOSIS — R160 Hepatomegaly, not elsewhere classified: Secondary | ICD-10-CM | POA: Diagnosis not present

## 2018-03-21 DIAGNOSIS — I10 Essential (primary) hypertension: Secondary | ICD-10-CM | POA: Diagnosis not present

## 2018-03-21 LAB — COMPREHENSIVE METABOLIC PANEL
ALT: 15 U/L (ref 0–44)
ANION GAP: 8 (ref 5–15)
AST: 18 U/L (ref 15–41)
Albumin: 3.7 g/dL (ref 3.5–5.0)
Alkaline Phosphatase: 66 U/L (ref 38–126)
BUN: 10 mg/dL (ref 6–20)
CHLORIDE: 103 mmol/L (ref 98–111)
CO2: 24 mmol/L (ref 22–32)
Calcium: 9.1 mg/dL (ref 8.9–10.3)
Creatinine, Ser: 0.86 mg/dL (ref 0.44–1.00)
Glucose, Bld: 98 mg/dL (ref 70–99)
POTASSIUM: 3.5 mmol/L (ref 3.5–5.1)
Sodium: 135 mmol/L (ref 135–145)
Total Bilirubin: 0.5 mg/dL (ref 0.3–1.2)
Total Protein: 7.1 g/dL (ref 6.5–8.1)

## 2018-03-21 LAB — CBC WITH DIFFERENTIAL/PLATELET
Abs Immature Granulocytes: 0.03 10*3/uL (ref 0.00–0.07)
BASOS PCT: 1 %
Basophils Absolute: 0.1 10*3/uL (ref 0.0–0.1)
EOS ABS: 0.2 10*3/uL (ref 0.0–0.5)
EOS PCT: 3 %
HCT: 43.1 % (ref 36.0–46.0)
Hemoglobin: 14.4 g/dL (ref 12.0–15.0)
IMMATURE GRANULOCYTES: 0 %
Lymphocytes Relative: 33 %
Lymphs Abs: 2.3 10*3/uL (ref 0.7–4.0)
MCH: 33.5 pg (ref 26.0–34.0)
MCHC: 33.4 g/dL (ref 30.0–36.0)
MCV: 100.2 fL — AB (ref 80.0–100.0)
MONOS PCT: 6 %
Monocytes Absolute: 0.4 10*3/uL (ref 0.1–1.0)
NRBC: 0 % (ref 0.0–0.2)
Neutro Abs: 4 10*3/uL (ref 1.7–7.7)
Neutrophils Relative %: 57 %
PLATELETS: 199 10*3/uL (ref 150–400)
RBC: 4.3 MIL/uL (ref 3.87–5.11)
RDW: 12 % (ref 11.5–15.5)
WBC: 6.9 10*3/uL (ref 4.0–10.5)

## 2018-03-21 LAB — LIPASE, BLOOD: Lipase: 48 U/L (ref 11–51)

## 2018-03-21 MED ORDER — OXYCODONE-ACETAMINOPHEN 5-325 MG PO TABS
1.0000 | ORAL_TABLET | Freq: Once | ORAL | Status: AC
  Administered 2018-03-21: 1 via ORAL
  Filled 2018-03-21: qty 1
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
## 2018-03-21 NOTE — ED Triage Notes (Signed)
R side flank pain radiating into her back since Saturday. [facility]

## 2018-03-21 NOTE — Discharge Instructions (Addendum)
°  Ultrasound Impression needing follow up:  1. Subtle area of decreased attenuation in the left lobe of the liver concerning for mass. This lesion does not have an appearance suggestive of hemangioma. Given a questionable mass in this area, would advise either CT or MR of the liver pre and serial post-contrast to further evaluate.

## 2018-03-21 NOTE — ED Notes (Signed)
Patient transported to Ultrasound 
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
## 2018-03-29 ENCOUNTER — Emergency Department (HOSPITAL_BASED_OUTPATIENT_CLINIC_OR_DEPARTMENT_OTHER): Payer: No Typology Code available for payment source

## 2018-03-29 ENCOUNTER — Emergency Department (HOSPITAL_BASED_OUTPATIENT_CLINIC_OR_DEPARTMENT_OTHER)
Admission: EM | Admit: 2018-03-29 | Discharge: 2018-03-29 | Disposition: A | Discharge status: Home / Self Care | Payer: No Typology Code available for payment source | Attending: Emergency Medicine | Admitting: Emergency Medicine

## 2018-03-29 ENCOUNTER — Other Ambulatory Visit: Payer: Self-pay

## 2018-03-29 ENCOUNTER — Encounter (HOSPITAL_BASED_OUTPATIENT_CLINIC_OR_DEPARTMENT_OTHER): Payer: Self-pay

## 2018-03-29 DIAGNOSIS — F329 Major depressive disorder, single episode, unspecified: Secondary | ICD-10-CM | POA: Diagnosis not present

## 2018-03-29 DIAGNOSIS — I10 Essential (primary) hypertension: Secondary | ICD-10-CM | POA: Insufficient documentation

## 2018-03-29 DIAGNOSIS — S060X0A Concussion without loss of consciousness, initial encounter: Secondary | ICD-10-CM | POA: Diagnosis not present

## 2018-03-29 DIAGNOSIS — F17210 Nicotine dependence, cigarettes, uncomplicated: Secondary | ICD-10-CM | POA: Diagnosis not present

## 2018-03-29 DIAGNOSIS — Y92410 Unspecified street and highway as the place of occurrence of the external cause: Secondary | ICD-10-CM | POA: Insufficient documentation

## 2018-03-29 DIAGNOSIS — Y998 Other external cause status: Secondary | ICD-10-CM | POA: Insufficient documentation

## 2018-03-29 DIAGNOSIS — Y9389 Activity, other specified: Secondary | ICD-10-CM | POA: Diagnosis not present

## 2018-03-29 DIAGNOSIS — S0990XD Unspecified injury of head, subsequent encounter: Secondary | ICD-10-CM | POA: Diagnosis present

## 2018-03-29 DIAGNOSIS — R112 Nausea with vomiting, unspecified: Secondary | ICD-10-CM

## 2018-03-29 MED ORDER — TRAMADOL HCL 50 MG PO TABS: 50.0000 mg | ORAL_TABLET | Freq: Four times a day (QID) | ORAL | 0 refills | Status: AC | PRN

## 2018-03-29 MED ORDER — ONDANSETRON 4 MG PO TBDP
4.0000 mg | ORAL_TABLET | Freq: Once | ORAL | Status: AC
  Administered 2018-03-29: 4 mg via ORAL
  Filled 2018-03-29: qty 1

## 2018-03-29 MED ORDER — ONDANSETRON 4 MG PO TBDP: 4.0000 mg | ORAL_TABLET | Freq: Three times a day (TID) | ORAL | 0 refills | Status: DC | PRN
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
## 2018-03-30 MED FILL — traMADol HCL 50 MG TABS: 50 | 3 days supply | Qty: 12 | Fill #0

## 2018-03-30 MED FILL — ONDANSETRON ODT 4 MG TABLET: 4 | 7 days supply | Qty: 20 | Fill #0

## 2018-04-11 ENCOUNTER — Other Ambulatory Visit: Payer: Self-pay | Admitting: Family Medicine

## 2018-04-11 DIAGNOSIS — R160 Hepatomegaly, not elsewhere classified: Secondary | ICD-10-CM

## 2018-04-22 ENCOUNTER — Other Ambulatory Visit: Payer: Medicaid Other

## 2018-05-09 ENCOUNTER — Ambulatory Visit
Admission: RE | Admit: 2018-05-09 | Discharge: 2018-05-09 | Disposition: A | Discharge status: Home / Self Care | Payer: Self-pay | Source: Ambulatory Visit | Attending: Family Medicine | Admitting: Family Medicine

## 2018-05-09 DIAGNOSIS — N281 Cyst of kidney, acquired: Secondary | ICD-10-CM | POA: Diagnosis not present

## 2018-05-09 DIAGNOSIS — R160 Hepatomegaly, not elsewhere classified: Secondary | ICD-10-CM

## 2018-05-09 MED ORDER — GADOBENATE DIMEGLUMINE 529 MG/ML IV SOLN
18.0000 mL | Freq: Once | INTRAVENOUS | Status: AC | PRN
  Administered 2018-05-09: 18 mL via INTRAVENOUS

## 2018-05-15 MED FILL — CYCLOBENZAPRINE HCL 10 MG T: 10 | 30 days supply | Qty: 30 | Fill #1

## 2018-05-15 MED FILL — PROMETHAZINE 25 MG TABLET: 25 | 7 days supply | Qty: 30 | Fill #1

## 2018-05-15 MED FILL — HYDROCHLOROTHIAZIDE 25 MG T: 25 | 30 days supply | Qty: 30 | Fill #1

## 2018-05-15 MED FILL — CARVEDILOL 12.5 MG TABLET: 12.5 | 30 days supply | Qty: 60 | Fill #1

## 2018-05-15 MED FILL — lamoTRIgine 100 MG TABS: 100 | 30 days supply | Qty: 120 | Fill #2

## 2018-07-10 MED FILL — lamoTRIgine 100 MG TABS: 100 | 30 days supply | Qty: 120 | Fill #3

## 2018-07-10 MED FILL — CARVEDILOL 12.5 MG TABLET: 12.5 | 30 days supply | Qty: 60 | Fill #2

## 2018-07-10 MED FILL — HYDROCHLOROTHIAZIDE 25 MG T: 25 | 30 days supply | Qty: 30 | Fill #2

## 2018-07-10 MED FILL — CYCLOBENZAPRINE HCL 10 MG T: 10 | 30 days supply | Qty: 30 | Fill #2

## 2018-07-16 ENCOUNTER — Emergency Department (HOSPITAL_BASED_OUTPATIENT_CLINIC_OR_DEPARTMENT_OTHER): Payer: BLUE CROSS/BLUE SHIELD

## 2018-07-16 ENCOUNTER — Encounter (HOSPITAL_BASED_OUTPATIENT_CLINIC_OR_DEPARTMENT_OTHER): Payer: Self-pay | Admitting: Emergency Medicine

## 2018-07-16 ENCOUNTER — Emergency Department (HOSPITAL_BASED_OUTPATIENT_CLINIC_OR_DEPARTMENT_OTHER)
Admission: EM | Admit: 2018-07-16 | Discharge: 2018-07-16 | Disposition: A | Discharge status: Home / Self Care | Payer: BLUE CROSS/BLUE SHIELD | Attending: Emergency Medicine | Admitting: Emergency Medicine

## 2018-07-16 ENCOUNTER — Other Ambulatory Visit: Payer: Self-pay

## 2018-07-16 DIAGNOSIS — N12 Tubulo-interstitial nephritis, not specified as acute or chronic: Secondary | ICD-10-CM | POA: Diagnosis not present

## 2018-07-16 DIAGNOSIS — I10 Essential (primary) hypertension: Secondary | ICD-10-CM | POA: Insufficient documentation

## 2018-07-16 DIAGNOSIS — F17210 Nicotine dependence, cigarettes, uncomplicated: Secondary | ICD-10-CM | POA: Insufficient documentation

## 2018-07-16 DIAGNOSIS — N200 Calculus of kidney: Secondary | ICD-10-CM | POA: Diagnosis not present

## 2018-07-16 DIAGNOSIS — Z79899 Other long term (current) drug therapy: Secondary | ICD-10-CM | POA: Diagnosis not present

## 2018-07-16 DIAGNOSIS — R109 Unspecified abdominal pain: Secondary | ICD-10-CM | POA: Diagnosis not present

## 2018-07-16 DIAGNOSIS — N10 Acute tubulo-interstitial nephritis: Secondary | ICD-10-CM | POA: Diagnosis not present

## 2018-07-16 LAB — CBC WITH DIFFERENTIAL/PLATELET
ABS IMMATURE GRANULOCYTES: 0.04 10*3/uL (ref 0.00–0.07)
BASOS PCT: 1 %
Basophils Absolute: 0.1 10*3/uL (ref 0.0–0.1)
Eosinophils Absolute: 0.2 10*3/uL (ref 0.0–0.5)
Eosinophils Relative: 2 %
HCT: 47 % — ABNORMAL HIGH (ref 36.0–46.0)
Hemoglobin: 15.9 g/dL — ABNORMAL HIGH (ref 12.0–15.0)
IMMATURE GRANULOCYTES: 0 %
LYMPHS PCT: 36 %
Lymphs Abs: 3.2 10*3/uL (ref 0.7–4.0)
MCH: 32.8 pg (ref 26.0–34.0)
MCHC: 33.8 g/dL (ref 30.0–36.0)
MCV: 96.9 fL (ref 80.0–100.0)
MONOS PCT: 8 %
Monocytes Absolute: 0.7 10*3/uL (ref 0.1–1.0)
NEUTROS ABS: 4.7 10*3/uL (ref 1.7–7.7)
Neutrophils Relative %: 53 %
PLATELETS: 265 10*3/uL (ref 150–400)
RBC: 4.85 MIL/uL (ref 3.87–5.11)
RDW: 12.4 % (ref 11.5–15.5)
WBC: 8.9 10*3/uL (ref 4.0–10.5)
nRBC: 0 % (ref 0.0–0.2)

## 2018-07-16 LAB — URINALYSIS, ROUTINE W REFLEX MICROSCOPIC
Bilirubin Urine: NEGATIVE
Glucose, UA: NEGATIVE mg/dL
KETONES UR: NEGATIVE mg/dL
Nitrite: POSITIVE — AB
Protein, ur: NEGATIVE mg/dL
SPECIFIC GRAVITY, URINE: 1.01 (ref 1.005–1.030)
pH: 6 (ref 5.0–8.0)

## 2018-07-16 LAB — COMPREHENSIVE METABOLIC PANEL
ALT: 15 U/L (ref 0–44)
AST: 20 U/L (ref 15–41)
Albumin: 4.3 g/dL (ref 3.5–5.0)
Alkaline Phosphatase: 94 U/L (ref 38–126)
Anion gap: 9 (ref 5–15)
BUN: 15 mg/dL (ref 6–20)
CHLORIDE: 101 mmol/L (ref 98–111)
CO2: 26 mmol/L (ref 22–32)
Calcium: 9.9 mg/dL (ref 8.9–10.3)
Creatinine, Ser: 0.76 mg/dL (ref 0.44–1.00)
GFR calc Af Amer: >60 mL/min (ref >60)
Glucose, Bld: 86 mg/dL (ref 70–99)
Potassium: 3.5 mmol/L (ref 3.5–5.1)
SODIUM: 136 mmol/L (ref 135–145)
Total Bilirubin: 0.6 mg/dL (ref 0.3–1.2)
Total Protein: 8.1 g/dL (ref 6.5–8.1)

## 2018-07-16 LAB — URINALYSIS, MICROSCOPIC (REFLEX): RBC / HPF: >50 RBC/hpf (ref 0–5)

## 2018-07-16 MED ORDER — KETOROLAC TROMETHAMINE 15 MG/ML IJ SOLN
30.0000 mg | Freq: Once | INTRAMUSCULAR | Status: AC | PRN
  Administered 2018-07-16: 30 mg via INTRAVENOUS
  Filled 2018-07-16: qty 2

## 2018-07-16 MED ORDER — SODIUM CHLORIDE 0.9 % IV BOLUS
500.0000 mL | Freq: Once | INTRAVENOUS | Status: AC
  Administered 2018-07-16: 500 mL via INTRAVENOUS

## 2018-07-16 MED ORDER — CEPHALEXIN 500 MG PO CAPS: 500.0000 mg | ORAL_CAPSULE | Freq: Four times a day (QID) | ORAL | 0 refills | Status: DC

## 2018-07-16 NOTE — ED Provider Notes (Signed)
[facility] EMERGENCY DEPARTMENT Provider Note   CSN: [id number] Arrival date & time: 07/16/18  1424    History   Chief Complaint Chief Complaint  Patient presents with  . Flank Pain    HPI Heather Gomez is a 53 y.o. female.     The history is provided by the patient and medical records. No language interpreter was used.  Flank Pain  Pertinent negatives include no abdominal pain.   Heather Gomez is a 53 y.o. female  with a PMH of kidney stones who presents to the Emergency Department complaining of left-sided back pain intermittently radiating to the left flank which began last night.  Patient states the pain has been waxing and waning in intensity.  She reports having little urine output when she uses the restroom, but denies any burning, urgency or frequency.  She denies any nausea or vomiting.  She states that her kidney stones in the past have produced back pain without nausea or vomiting as well.  She has not taken any medication prior to arrival for symptoms.  She reports that her daughter is a drug addict, therefore she is hesitant to take medication of any kind for her pain.  Denies any fever or chills.  No abdominal pain.   Past Medical History:  Diagnosis Date  . Depression   . Hypertension   . Kidney stones   . Migraines     There are no active problems to display for this patient.   Past Surgical History:  Procedure Laterality Date  . LITHOTRIPSY    . URETERAL STENT PLACEMENT    . WISDOM TOOTH EXTRACTION       OB History   No obstetric history on file.      Home Medications    Prior to Admission medications   Medication Sig Start Date End Date Taking? Authorizing Provider  carvedilol (COREG) 12.5 MG tablet Take 12.5 mg by mouth 2 (two) times daily with a meal.   Yes [provider]  cyclobenzaprine (FLEXERIL) 10 MG tablet Take 10 mg by mouth 3 (three) times daily as needed.   Yes [provider]   hydrochlorothiazide (HYDRODIURIL) 25 MG tablet Take 25 mg by mouth daily.   Yes [provider]  ibuprofen (ADVIL,MOTRIN) 800 MG tablet Take 1 tablet (800 mg total) by mouth 3 (three) times daily. 01/03/14  Yes Ladona Mow, PA-C  lamoTRIgine (LAMICTAL) 100 MG tablet Take 100 mg by mouth 2 (two) times daily.   Yes [provider]  promethazine (PHENERGAN) 25 MG tablet Take 25 mg by mouth every 6 (six) hours as needed.   Yes [provider]  sertraline (ZOLOFT) 100 MG tablet Take 100 mg by mouth daily.   Yes [provider]  cephALEXin (KEFLEX) 500 MG capsule Take 1 capsule (500 mg total) by mouth 4 (four) times daily. 07/16/18   Sharlett Lienemann, Chase Picket, PA-C  eletriptan (RELPAX) 40 MG tablet One tablet by mouth at onset of headache. May repeat in 2 hours if headache persists or recurs. may repeat in 2 hours if necessary    [provider]  hydrocortisone cream 1 % Apply to affected area 2 times daily 01/03/14   Ladona Mow, PA-C  ondansetron (ZOFRAN ODT) 4 MG disintegrating tablet Take 1 tablet (4 mg total) by mouth every 8 (eight) hours as needed for nausea or vomiting. 03/29/18   Long, Arlyss Repress, MD    Family History History reviewed. No pertinent family history.  Social  History Social History   Tobacco Use  . Smoking status: Current Every Day Smoker    Packs/day: 1.00    Types: Cigarettes  . Smokeless tobacco: Never Used  Substance Use Topics  . Alcohol use: No  . Drug use: No     Allergies   Labetalol; Percocet [oxycodone-acetaminophen]; and Propranolol   Review of Systems Review of Systems  Constitutional: Negative for fever.  Gastrointestinal: Negative for abdominal pain, diarrhea, nausea and vomiting.  Genitourinary: Positive for difficulty urinating and flank pain. Negative for dysuria, frequency, urgency and vaginal discharge.  Musculoskeletal: Positive for back pain.  All other systems reviewed and are negative.    Physical Exam  Updated Vital Signs BP (!) 128/98 (BP Location: Right Arm)   Pulse 86   Temp 98.5 F (36.9 C) (Oral)   Resp 18   Ht 5\' 5"  (1.651 m)   Wt 90.7 kg   LMP  (LMP Unknown)   SpO2 97%   BMI 33.28 kg/m   Physical Exam Vitals signs and nursing note reviewed.  Constitutional:      General: She is not in acute distress.    Appearance: She is well-developed.     Comments: Nontoxic-appearing.  HENT:     Head: Normocephalic and atraumatic.  Neck:     Musculoskeletal: Neck supple.  Cardiovascular:     Rate and Rhythm: Normal rate and regular rhythm.     Heart sounds: Normal heart sounds. No murmur.     Comments: Regular rate and rhythm on exam. Pulmonary:     Effort: Pulmonary effort is normal. No respiratory distress.     Breath sounds: Normal breath sounds.  Abdominal:     General: There is no distension.     Palpations: Abdomen is soft.     Comments: No abdominal tenderness.  Mild tenderness to left back and flank.  No overt CVA tenderness.  Skin:    General: Skin is warm and dry.  Neurological:     Mental Status: She is alert and oriented to person, place, and time.      ED Treatments / Results  Labs (all labs ordered are listed, but only abnormal results are displayed) Labs Reviewed  CBC WITH DIFFERENTIAL/PLATELET - Abnormal; Notable for the following components:      Result Value   Hemoglobin 15.9 (*)    HCT 47.0 (*)    All other components within normal limits  URINALYSIS, ROUTINE W REFLEX MICROSCOPIC - Abnormal; Notable for the following components:   APPearance CLOUDY (*)    Hgb urine dipstick MODERATE (*)    Nitrite POSITIVE (*)    Leukocytes,Ua LARGE (*)    All other components within normal limits  URINALYSIS, MICROSCOPIC (REFLEX) - Abnormal; Notable for the following components:   Bacteria, UA MANY (*)    All other components within normal limits  COMPREHENSIVE METABOLIC PANEL    EKG None  Radiology Ct Renal Stone Study  Result Date: 07/16/2018  CLINICAL DATA:  LEFT flank pain with urinary urgency/frequency. EXAM: CT ABDOMEN AND PELVIS WITHOUT CONTRAST TECHNIQUE: Multidetector CT imaging of the abdomen and pelvis was performed following the standard protocol without IV contrast. COMPARISON:  CT abdomen dated 10/03/2011. FINDINGS: Lower chest: No acute abnormality. Hepatobiliary: No focal liver abnormality is seen. No gallstones, gallbladder wall thickening, or biliary dilatation. Pancreas: Unremarkable. No pancreatic ductal dilatation or surrounding inflammatory changes. Spleen: Normal in size without focal abnormality. Adrenals/Urinary Tract: There is bilateral medullary nephrocalcinosis. Probable associated RIGHT renal stone measures 4  mm. No hydronephrosis bilaterally. No ureteral or bladder calculi. Bladder is partially decompressed limiting characterization of the bladder walls, however, I suspect at least mild circumferential thickening of the walls of the bladder. Stomach/Bowel: No dilated large or small bowel loops. No evidence of bowel wall inflammation. Scattered mild diverticulosis without evidence of acute diverticulitis. Appendix is within normal limits in size and there is no periappendiceal inflammation. Stomach is unremarkable, partially decompressed. Vascular/Lymphatic: Aortic atherosclerosis. No enlarged lymph nodes seen in the abdomen or pelvis. Reproductive: Uterus and bilateral adnexa are unremarkable. Other: No free fluid or abscess collection. No free intraperitoneal air. Musculoskeletal: Mild degenerative spondylosis within the thoracic and lumbar spine. No acute or suspicious osseous finding. IMPRESSION: 1. Bilateral medullary nephrocalcinosis. Associated nonobstructing 4 mm right renal stone. No hydronephrosis bilaterally. No ureteral or bladder calculi seen. 2. Bladder is partially decompressed limiting characterization of the bladder walls, however, I suspect at least mild circumferential thickening of the walls of the bladder.  Recommend correlation with urinalysis to exclude associated cystitis. 3. Mild colonic diverticulosis without evidence of acute diverticulitis. Aortic Atherosclerosis (ICD10-I70.0). Electronically Signed   By: Bary RichardStan  Maynard M.D.   On: 07/16/2018 15:35    Procedures Procedures (including critical care time)  Medications Ordered in ED Medications  sodium chloride 0.9 % bolus 500 mL (0 mLs Intravenous Stopped 07/16/18 1605)  ketorolac (TORADOL) 15 MG/ML injection 30 mg (30 mg Intravenous Given 07/16/18 1510)     Initial Impression / Assessment and Plan / ED Course  I have reviewed the triage vital signs and the nursing notes.  Pertinent labs & imaging results that were available during my care of the patient were reviewed by me and considered in my medical decision making (see chart for details).       Heather Gomez is a 53 y.o. female who presents to ED for left sided back and flank pain with difficulty urinating which began yesterday.  She is well-appearing on exam.  She is afebrile and hemodynamically stable.  No abdominal tenderness, but does have left back and flank tenderness.  Urinalysis nitrite positive with large leuks and many bacteria.  Does have a history of kidney stones.  CT renal study shows no ureteral or bladder stones seen.  Findings consistent with cystitis which correlate with urinalysis.  She has not had any vomiting.  Feels as if she is a good candidate for outpatient treatment.  We discussed reasons to return to the emergency department including development of fever, vomiting, worsening pain or no improvement in 2 to 3 days.  Recommended that she call her PCP in the morning to schedule follow-up appointment.  All questions were answered and she was discharged home in satisfactory condition.    Final Clinical Impressions(s) / ED Diagnoses   Final diagnoses:  Pyelonephritis    ED Discharge Orders         Ordered    cephALEXin (KEFLEX) 500 MG capsule  4 times  daily     07/16/18 1550           Quinterrius Errington, Chase PicketJaime Pilcher, PA-C 07/16/18 1608    Maia PlanLong, Joshua G, MD 07/16/18 [phone]

## 2018-07-16 NOTE — ED Triage Notes (Signed)
Pt here with left flank pain and difficulty urinating

## 2018-07-16 NOTE — Discharge Instructions (Signed)
Stay very well hydrated with plenty of water throughout the day. Please take antibiotic until completion. Follow up with primary care physician in 1 week for recheck of ongoing symptoms.  Please seek immediate care if you develop the following: Your symptoms are no better or worse in 3 days. You develop a fever You begin vomiting.  You have new or worsening symptoms or any additional concerns.

## 2018-08-22 MED FILL — CYCLOBENZAPRINE HCL 10 MG T: 10 | 30 days supply | Qty: 30 | Fill #3

## 2018-08-22 MED FILL — HYDROCHLOROTHIAZIDE 25 MG T: 25 | 30 days supply | Qty: 30 | Fill #3

## 2018-08-22 MED FILL — PROMETHAZINE 25 MG TABLET: 25 | 7 days supply | Qty: 30 | Fill #2

## 2018-08-22 MED FILL — LAMOTRIGINE 100 MG TABS: 100 | 30 days supply | Qty: 120 | Fill #4

## 2018-08-22 MED FILL — CARVEDILOL 12.5 MG TABLET: 12.5 | 30 days supply | Qty: 60 | Fill #3

## 2018-10-12 MED FILL — CARVEDILOL 12.5 MG TABLET: 12.5 | 30 days supply | Qty: 60 | Fill #4

## 2018-10-12 MED FILL — HYDROCHLOROTHIAZIDE 25 MG T: 25 | 30 days supply | Qty: 30 | Fill #4

## 2018-10-12 MED FILL — LAMOTRIGINE 100 MG TABS: 100 | 30 days supply | Qty: 120 | Fill #5

## 2018-10-12 MED FILL — CYCLOBENZAPRINE HCL 10 MG T: 10 | 30 days supply | Qty: 30 | Fill #4

## 2018-10-19 MED FILL — SERTRALINE HCL 100 MG TAB: 100 | 90 days supply | Qty: 90 | Fill #0

## 2018-12-08 DIAGNOSIS — Z720 Tobacco use: Secondary | ICD-10-CM | POA: Diagnosis not present

## 2018-12-08 DIAGNOSIS — I10 Essential (primary) hypertension: Secondary | ICD-10-CM | POA: Diagnosis not present

## 2018-12-08 DIAGNOSIS — G4733 Obstructive sleep apnea (adult) (pediatric): Secondary | ICD-10-CM | POA: Diagnosis not present

## 2018-12-08 DIAGNOSIS — G43909 Migraine, unspecified, not intractable, without status migrainosus: Secondary | ICD-10-CM | POA: Diagnosis not present

## 2018-12-08 MED FILL — HYDROCHLOROTHIAZIDE 25 MG T: 25 | 30 days supply | Qty: 30 | Fill #0

## 2018-12-08 MED FILL — CARVEDILOL 12.5 MG TABLET: 12.5 | 30 days supply | Qty: 60 | Fill #0

## 2018-12-08 MED FILL — PROMETHAZINE 25 MG TABLET: 25 | 7 days supply | Qty: 30 | Fill #0

## 2018-12-08 MED FILL — CYCLOBENZAPRINE HCL 10 MG T: 10 | 30 days supply | Qty: 30 | Fill #0

## 2018-12-13 DIAGNOSIS — G43719 Chronic migraine without aura, intractable, without status migrainosus: Secondary | ICD-10-CM | POA: Diagnosis not present

## 2018-12-13 DIAGNOSIS — R51 Headache: Secondary | ICD-10-CM | POA: Diagnosis not present

## 2018-12-13 DIAGNOSIS — G43019 Migraine without aura, intractable, without status migrainosus: Secondary | ICD-10-CM | POA: Diagnosis not present

## 2018-12-13 MED FILL — LAMOTRIGINE 100 MG TABS: 100 | 30 days supply | Qty: 120 | Fill #0

## 2019-02-01 MED FILL — LAMOTRIGINE 100 MG TABS: 100 | 30 days supply | Qty: 120 | Fill #1

## 2019-02-01 MED FILL — CYCLOBENZAPRINE HCL 10 MG T: 10 | 30 days supply | Qty: 30 | Fill #1

## 2019-02-01 MED FILL — HYDROCHLOROTHIAZIDE 25 MG T: 25 | 30 days supply | Qty: 30 | Fill #1

## 2019-02-01 MED FILL — CARVEDILOL 12.5 MG TABLET: 12.5 | 30 days supply | Qty: 60 | Fill #1

## 2019-02-01 MED FILL — SERTRALINE HCL 100 MG TABS: 100 | 90 days supply | Qty: 90 | Fill #0

## 2019-02-15 ENCOUNTER — Emergency Department (HOSPITAL_COMMUNITY): Payer: BC Managed Care – PPO

## 2019-02-15 ENCOUNTER — Observation Stay (HOSPITAL_COMMUNITY): Payer: BC Managed Care – PPO

## 2019-02-15 ENCOUNTER — Other Ambulatory Visit: Payer: Self-pay

## 2019-02-15 ENCOUNTER — Encounter (HOSPITAL_COMMUNITY): Payer: Self-pay | Admitting: Neurology

## 2019-02-15 ENCOUNTER — Observation Stay (HOSPITAL_COMMUNITY)
Admission: EM | Admit: 2019-02-15 | Discharge: 2019-02-16 | Disposition: A | Discharge status: Home / Self Care | Payer: BC Managed Care – PPO | Attending: Internal Medicine | Admitting: Internal Medicine

## 2019-02-15 DIAGNOSIS — F17210 Nicotine dependence, cigarettes, uncomplicated: Secondary | ICD-10-CM | POA: Insufficient documentation

## 2019-02-15 DIAGNOSIS — F17200 Nicotine dependence, unspecified, uncomplicated: Secondary | ICD-10-CM

## 2019-02-15 DIAGNOSIS — R200 Anesthesia of skin: Secondary | ICD-10-CM | POA: Diagnosis not present

## 2019-02-15 DIAGNOSIS — E785 Hyperlipidemia, unspecified: Secondary | ICD-10-CM | POA: Diagnosis not present

## 2019-02-15 DIAGNOSIS — F32A Depression, unspecified: Secondary | ICD-10-CM

## 2019-02-15 DIAGNOSIS — F329 Major depressive disorder, single episode, unspecified: Secondary | ICD-10-CM | POA: Diagnosis not present

## 2019-02-15 DIAGNOSIS — I10 Essential (primary) hypertension: Secondary | ICD-10-CM | POA: Diagnosis not present

## 2019-02-15 DIAGNOSIS — Z20828 Contact with and (suspected) exposure to other viral communicable diseases: Secondary | ICD-10-CM | POA: Insufficient documentation

## 2019-02-15 DIAGNOSIS — R29810 Facial weakness: Secondary | ICD-10-CM | POA: Diagnosis not present

## 2019-02-15 DIAGNOSIS — G43909 Migraine, unspecified, not intractable, without status migrainosus: Secondary | ICD-10-CM

## 2019-02-15 DIAGNOSIS — G459 Transient cerebral ischemic attack, unspecified: Principal | ICD-10-CM | POA: Insufficient documentation

## 2019-02-15 DIAGNOSIS — R4182 Altered mental status, unspecified: Secondary | ICD-10-CM | POA: Diagnosis not present

## 2019-02-15 DIAGNOSIS — R42 Dizziness and giddiness: Secondary | ICD-10-CM | POA: Diagnosis not present

## 2019-02-15 DIAGNOSIS — Z79899 Other long term (current) drug therapy: Secondary | ICD-10-CM | POA: Insufficient documentation

## 2019-02-15 DIAGNOSIS — R531 Weakness: Secondary | ICD-10-CM

## 2019-02-15 DIAGNOSIS — R29818 Other symptoms and signs involving the nervous system: Secondary | ICD-10-CM | POA: Diagnosis not present

## 2019-02-15 LAB — DIFFERENTIAL
Abs Immature Granulocytes: 0.06 10*3/uL (ref 0.00–0.07)
Basophils Absolute: 0.1 10*3/uL (ref 0.0–0.1)
Basophils Relative: 1 %
Eosinophils Absolute: 0.2 10*3/uL (ref 0.0–0.5)
Eosinophils Relative: 2 %
Immature Granulocytes: 1 %
Lymphocytes Relative: 23 %
Lymphs Abs: 2.4 10*3/uL (ref 0.7–4.0)
Monocytes Absolute: 1 10*3/uL (ref 0.1–1.0)
Monocytes Relative: 10 %
Neutro Abs: 6.7 10*3/uL (ref 1.7–7.7)
Neutrophils Relative %: 63 %

## 2019-02-15 LAB — CBC
HCT: 44.1 % (ref 36.0–46.0)
Hemoglobin: 15.2 g/dL — ABNORMAL HIGH (ref 12.0–15.0)
MCH: 34.4 pg — ABNORMAL HIGH (ref 26.0–34.0)
MCHC: 34.5 g/dL (ref 30.0–36.0)
MCV: 99.8 fL (ref 80.0–100.0)
Platelets: 220 10*3/uL (ref 150–400)
RBC: 4.42 MIL/uL (ref 3.87–5.11)
RDW: 11.9 % (ref 11.5–15.5)
WBC: 10.4 10*3/uL (ref 4.0–10.5)
nRBC: 0 % (ref 0.0–0.2)

## 2019-02-15 LAB — COMPREHENSIVE METABOLIC PANEL
ALT: 18 U/L (ref 0–44)
AST: 24 U/L (ref 15–41)
Albumin: 3.9 g/dL (ref 3.5–5.0)
Alkaline Phosphatase: 99 U/L (ref 38–126)
Anion gap: 11 (ref 5–15)
BUN: 11 mg/dL (ref 6–20)
CO2: 27 mmol/L (ref 22–32)
Calcium: 9.6 mg/dL (ref 8.9–10.3)
Chloride: 101 mmol/L (ref 98–111)
Creatinine, Ser: 0.94 mg/dL (ref 0.44–1.00)
GFR calc Af Amer: >60 mL/min (ref >60)
GFR calc non Af Amer: >60 mL/min (ref >60)
Glucose, Bld: 86 mg/dL (ref 70–99)
Potassium: 4 mmol/L (ref 3.5–5.1)
Sodium: 139 mmol/L (ref 135–145)
Total Bilirubin: 0.9 mg/dL (ref 0.3–1.2)
Total Protein: 7.5 g/dL (ref 6.5–8.1)

## 2019-02-15 LAB — I-STAT BETA HCG BLOOD, ED (MC, WL, AP ONLY): I-stat hCG, quantitative: <5 m[IU]/mL (ref <5)

## 2019-02-15 LAB — PROTIME-INR
INR: 1 (ref 0.8–1.2)
Prothrombin Time: 13 seconds (ref 11.4–15.2)

## 2019-02-15 LAB — CBG MONITORING, ED: Glucose-Capillary: 104 mg/dL — ABNORMAL HIGH (ref 70–99)

## 2019-02-15 LAB — I-STAT CHEM 8, ED
BUN: 13 mg/dL (ref 6–20)
Calcium, Ion: 1.13 mmol/L — ABNORMAL LOW (ref 1.15–1.40)
Chloride: 99 mmol/L (ref 98–111)
Creatinine, Ser: 0.9 mg/dL (ref 0.44–1.00)
Glucose, Bld: 84 mg/dL (ref 70–99)
HCT: 46 % (ref 36.0–46.0)
Hemoglobin: 15.6 g/dL — ABNORMAL HIGH (ref 12.0–15.0)
Potassium: 3.9 mmol/L (ref 3.5–5.1)
Sodium: 138 mmol/L (ref 135–145)
TCO2: 29 mmol/L (ref 22–32)

## 2019-02-15 LAB — APTT: aPTT: 35 seconds (ref 24–36)

## 2019-02-15 LAB — SARS CORONAVIRUS 2 (TAT 6-24 HRS): SARS Coronavirus 2: NEGATIVE

## 2019-02-15 MED ORDER — PROMETHAZINE HCL 25 MG PO TABS: 25.0000 mg | ORAL_TABLET | Freq: Four times a day (QID) | ORAL | Status: DC | PRN

## 2019-02-15 MED ORDER — ADULT MULTIVITAMIN W/MINERALS CH
1.0000 | ORAL_TABLET | Freq: Every day | ORAL | Status: DC
  Administered 2019-02-16: 1 via ORAL
  Filled 2019-02-15: qty 1

## 2019-02-15 MED ORDER — STROKE: EARLY STAGES OF RECOVERY BOOK
Freq: Once | Status: DC
  Filled 2019-02-15: qty 1

## 2019-02-15 MED ORDER — ASPIRIN EC 81 MG PO TBEC
81.0000 mg | DELAYED_RELEASE_TABLET | Freq: Every day | ORAL | Status: DC
  Administered 2019-02-16: 81 mg via ORAL
  Filled 2019-02-15: qty 1

## 2019-02-15 MED ORDER — ATORVASTATIN CALCIUM 40 MG PO TABS: 40.0000 mg | ORAL_TABLET | Freq: Every day | ORAL | Status: DC

## 2019-02-15 MED ORDER — ENOXAPARIN SODIUM 40 MG/0.4ML ~~LOC~~ SOLN: 40.0000 mg | SUBCUTANEOUS | Status: DC

## 2019-02-15 MED ORDER — ACETAMINOPHEN 650 MG RE SUPP: 650.0000 mg | RECTAL | Status: DC | PRN

## 2019-02-15 MED ORDER — LAMOTRIGINE 100 MG PO TABS
100.0000 mg | ORAL_TABLET | Freq: Two times a day (BID) | ORAL | Status: DC
  Administered 2019-02-15 – 2019-02-16 (×2): 100 mg via ORAL
  Filled 2019-02-15 (×3): qty 1

## 2019-02-15 MED ORDER — CARVEDILOL 6.25 MG PO TABS
6.2500 mg | ORAL_TABLET | Freq: Two times a day (BID) | ORAL | Status: DC
  Administered 2019-02-16: 6.25 mg via ORAL
  Filled 2019-02-15: qty 1

## 2019-02-15 MED ORDER — CYCLOBENZAPRINE HCL 10 MG PO TABS: 10.0000 mg | ORAL_TABLET | Freq: Three times a day (TID) | ORAL | Status: DC | PRN

## 2019-02-15 MED ORDER — CARVEDILOL 12.5 MG PO TABS: 12.5000 mg | ORAL_TABLET | Freq: Two times a day (BID) | ORAL | Status: DC

## 2019-02-15 MED ORDER — ACETAMINOPHEN 160 MG/5ML PO SOLN: 650.0000 mg | ORAL | Status: DC | PRN

## 2019-02-15 MED ORDER — SODIUM CHLORIDE 0.9% FLUSH
3.0000 mL | Freq: Once | INTRAVENOUS | Status: AC
Start: 2019-02-15 — End: 2019-02-15
  Administered 2019-02-15: 3 mL via INTRAVENOUS

## 2019-02-15 MED ORDER — SERTRALINE HCL 100 MG PO TABS
100.0000 mg | ORAL_TABLET | Freq: Every day | ORAL | Status: DC
  Administered 2019-02-15: 100 mg via ORAL
  Filled 2019-02-15 (×2): qty 1

## 2019-02-15 MED ORDER — ACETAMINOPHEN 325 MG PO TABS: 650.0000 mg | ORAL_TABLET | ORAL | Status: DC | PRN

## 2019-02-15 NOTE — ED Notes (Signed)
Patient transported to MRI, at this time.

## 2019-02-15 NOTE — ED Notes (Signed)
Pt returned to room from MRI.

## 2019-02-15 NOTE — ED Triage Notes (Signed)
Pt reports at [place] today she felt dizzy and Pt reported the Lt  Leg and Lt  Arm felt like lead. Pt reports a Hx of migraine HA and had 3 or more last week. Pt reports she did not have a migraine today. EMS vitals BP 188/88 . HR 88, CBG 105 .

## 2019-02-15 NOTE — H&P (Addendum)
History and Physical    DOA: 02/15/2019  PCP: Henrine Screws, MD  Patient coming from: Home/work  Chief Complaint: Right-sided weakness  HPI: Heather Gomez is a 53 y.o. female with history h/o hypertension, depression, smoking, migraines, kidney stones presents with complaints of acute onset of right-sided weakness around 11 AM today.  Patient states she was at work and she got up from her desk to reach out to some papers, when she turned around she felt dizzy/lightheaded and heavy in her right side.  Daughter apparently works with her and witnessed the episode when she appeared to be dragging her right side and appeared to be somewhat slurred.  Patient states she sat down in her chair and tried to open the cap of her water bottle but could not use her right arm.  EMS was summoned, her blood pressure was apparently within normal limits.  She was brought to the ED.  Her symptoms were resolving at the time of evaluation in the ED and completely resolved within 1.5 hours since onset.  Patient reports taking 4 baby aspirins last night for flank pain as she felt her kidney stones might be acting up.  Patient reports history of smoking but denies any history of alcohol/drug abuse. ED course: CBC and BMP within normal limits.  Covid testing negative.  MRI of the brain suspicious for tiny acute infarction in the corona radiata on the left.  MRA of the head unremarkable.    Review of Systems: As per HPI otherwise 10 point review of systems negative.    Past Medical History:  Diagnosis Date  . Depression   . Hypertension   . Kidney stones   . Migraines     Past Surgical History:  Procedure Laterality Date  . LITHOTRIPSY    . URETERAL STENT PLACEMENT    . WISDOM TOOTH EXTRACTION      Social history:  reports that she has been smoking cigarettes. She has been smoking about 1.00 pack per day. She has never used smokeless tobacco. She reports that she does not drink alcohol or use drugs.    Allergies  Allergen Reactions  . Labetalol Nausea And Vomiting  . Percocet [Oxycodone-Acetaminophen] Nausea And Vomiting  . Propranolol Other (See Comments)    "gives me migraines"    Family History  Problem Relation Age of Onset  . Hypertension Mother   . Hypertension Father       Prior to Admission medications   Medication Sig Start Date End Date Taking? Authorizing Provider  carvedilol (COREG) 12.5 MG tablet Take 12.5 mg by mouth 2 (two) times daily with a meal.   Yes [provider]  cyclobenzaprine (FLEXERIL) 10 MG tablet Take 10 mg by mouth 3 (three) times daily as needed for muscle spasms.    Yes [provider]  hydrochlorothiazide (HYDRODIURIL) 25 MG tablet Take 25 mg by mouth daily.   Yes [provider]  lamoTRIgine (LAMICTAL) 100 MG tablet Take 100 mg by mouth 2 (two) times daily.   Yes [provider]  Multiple Vitamin (MULTIVITAMIN WITH MINERALS) TABS tablet Take 1 tablet by mouth daily.   Yes [provider]  promethazine (PHENERGAN) 25 MG tablet Take 25 mg by mouth every 6 (six) hours as needed for nausea or vomiting.    Yes [provider]  sertraline (ZOLOFT) 100 MG tablet Take 100 mg by mouth at bedtime.    Yes [provider]    Physical Exam: Vitals:   02/15/19 1430  02/15/19 1445 02/15/19 1515 02/15/19 1825  BP: 114/90 117/89 110/80 118/86  Pulse:    84  Resp: 18 15 18 16   Temp:    98.6 F (37 C)  TempSrc:    Oral  SpO2:    97%  Weight:      Height:        Constitutional: NAD, calm, comfortable Eyes: PERRL, lids and conjunctivae normal ENMT: Mucous membranes are moist. Posterior pharynx clear of any exudate or lesions.Normal dentition.  Neck: normal, supple, no masses, no thyromegaly Respiratory: clear to auscultation bilaterally, no wheezing, no crackles. Normal respiratory effort. No accessory muscle use.  Cardiovascular: Regular rate and rhythm, no murmurs / rubs / gallops. No  extremity edema. 2+ pedal pulses. No carotid bruits.  Abdomen: no tenderness, no masses palpated. No hepatosplenomegaly. Bowel sounds positive.  Musculoskeletal: no clubbing / cyanosis. No joint deformity upper and lower extremities. Good ROM, no contractures. Normal muscle tone.  Neurologic: CN 2-12 grossly intact. Sensation intact, DTR normal. Strength 5/5 in all 4.  Finger-to-nose negative.  Extraocular movements intact.  No facial droop.  Speech normal. Psychiatric: Normal judgment and insight. Alert and oriented x 3. Normal mood.  SKIN/catheters: no rashes, lesions, ulcers. No induration  Labs on Admission: I have personally reviewed following labs and imaging studies  CBC: Recent Labs  Lab 02/15/19 1219 02/15/19 1225  WBC 10.4  --   NEUTROABS 6.7  --   HGB 15.2* 15.6*  HCT 44.1 46.0  MCV 99.8  --   PLT 220  --    Basic Metabolic Panel: Recent Labs  Lab 02/15/19 1219 02/15/19 1225  NA 139 138  K 4.0 3.9  CL 101 99  CO2 27  --   GLUCOSE 86 84  BUN 11 13  CREATININE 0.94 0.90  CALCIUM 9.6  --    GFR: Estimated Creatinine Clearance: 79.2 mL/min (by C-G formula based on SCr of 0.9 mg/dL). Liver Function Tests: Recent Labs  Lab 02/15/19 1219  AST 24  ALT 18  ALKPHOS 99  BILITOT 0.9  PROT 7.5  ALBUMIN 3.9   No results for input(s): LIPASE, AMYLASE in the last 168 hours. No results for input(s): AMMONIA in the last 168 hours. Coagulation Profile: Recent Labs  Lab 02/15/19 1219  INR 1.0   Cardiac Enzymes: No results for input(s): CKTOTAL, CKMB, CKMBINDEX, TROPONINI in the last 168 hours. BNP (last 3 results) No results for input(s): PROBNP in the last 8760 hours. HbA1C: No results for input(s): HGBA1C in the last 72 hours. CBG: Recent Labs  Lab 02/15/19 1246  GLUCAP 104*   Lipid Profile: No results for input(s): CHOL, HDL, LDLCALC, TRIG, CHOLHDL, LDLDIRECT in the last 72 hours. Thyroid Function Tests: No results for input(s): TSH, T4TOTAL, FREET4,  T3FREE, THYROIDAB in the last 72 hours. Anemia Panel: No results for input(s): VITAMINB12, FOLATE, FERRITIN, TIBC, IRON, RETICCTPCT in the last 72 hours. Urine analysis:    Component Value Date/Time   COLORURINE YELLOW 07/16/2018 1512   APPEARANCEUR CLOUDY (A) 07/16/2018 1512   LABSPEC 1.010 07/16/2018 1512   PHURINE 6.0 07/16/2018 1512   GLUCOSEU NEGATIVE 07/16/2018 1512   HGBUR MODERATE (A) 07/16/2018 1512   BILIRUBINUR NEGATIVE 07/16/2018 1512   KETONESUR NEGATIVE 07/16/2018 1512   PROTEINUR NEGATIVE 07/16/2018 1512   UROBILINOGEN 0.2 12/19/2013 1202   NITRITE POSITIVE (A) 07/16/2018 1512   LEUKOCYTESUR LARGE (A) 07/16/2018 1512    Radiological Exams on Admission: Personally reviewed  Mr Angio Head Wo Contrast  Result  Date: 02/15/2019 CLINICAL DATA:  Sudden onset right leg and right arm weakness and numbness. EXAM: MRI HEAD WITHOUT CONTRAST MRA HEAD WITHOUT CONTRAST TECHNIQUE: Multiplanar, multiecho pulse sequences of the brain and surrounding structures were obtained without intravenous contrast. Angiographic images of the head were obtained using MRA technique without contrast. COMPARISON:  Head CT same day FINDINGS: MRI HEAD FINDINGS Brain: Diffusion imaging does not show any definite infarction. One could question very minimal restricted diffusion in the coronal radiography on the left which could indicate a minor white matter insult. Elsewhere, the brainstem and cerebellum are normal. Cerebral hemispheres show mild chronic small-vessel ischemic change of the white matter in the frontal region, which is advanced for age. No cortical or large vessel territory infarction. No mass lesion, hemorrhage, hydrocephalus or extra-axial collection. Vascular: Major vessels at the base of the brain show flow. Skull and upper cervical spine: Negative Sinuses/Orbits: Clear/normal Other: None MRA HEAD FINDINGS Both internal carotid arteries are widely patent into the brain. No siphon stenosis. The  anterior and middle cerebral vessels are patent without proximal stenosis, aneurysm or vascular malformation. Both vertebral arteries are widely patent to the basilar. No basilar stenosis. Posterior circulation branch vessels appear normal. IMPRESSION: 1. Mild chronic small-vessel change of the hemispheric white matter, more than should be seen at this age. Diffusion imaging raises the question of a tiny acute infarction in the corona radiata on the left. I do not think this is definite, but is suspicious. 2. MRA head: No large or medium vessel occlusion or correctable proximal stenosis. Electronically Signed   By: Paulina FusiMark  Shogry M.D.   On: 02/15/2019 14:15   Mr Brain Wo Contrast  Result Date: 02/15/2019 CLINICAL DATA:  Sudden onset right leg and right arm weakness and numbness. EXAM: MRI HEAD WITHOUT CONTRAST MRA HEAD WITHOUT CONTRAST TECHNIQUE: Multiplanar, multiecho pulse sequences of the brain and surrounding structures were obtained without intravenous contrast. Angiographic images of the head were obtained using MRA technique without contrast. COMPARISON:  Head CT same day FINDINGS: MRI HEAD FINDINGS Brain: Diffusion imaging does not show any definite infarction. One could question very minimal restricted diffusion in the coronal radiography on the left which could indicate a minor white matter insult. Elsewhere, the brainstem and cerebellum are normal. Cerebral hemispheres show mild chronic small-vessel ischemic change of the white matter in the frontal region, which is advanced for age. No cortical or large vessel territory infarction. No mass lesion, hemorrhage, hydrocephalus or extra-axial collection. Vascular: Major vessels at the base of the brain show flow. Skull and upper cervical spine: Negative Sinuses/Orbits: Clear/normal Other: None MRA HEAD FINDINGS Both internal carotid arteries are widely patent into the brain. No siphon stenosis. The anterior and middle cerebral vessels are patent without  proximal stenosis, aneurysm or vascular malformation. Both vertebral arteries are widely patent to the basilar. No basilar stenosis. Posterior circulation branch vessels appear normal. IMPRESSION: 1. Mild chronic small-vessel change of the hemispheric white matter, more than should be seen at this age. Diffusion imaging raises the question of a tiny acute infarction in the corona radiata on the left. I do not think this is definite, but is suspicious. 2. MRA head: No large or medium vessel occlusion or correctable proximal stenosis. Electronically Signed   By: Paulina FusiMark  Shogry M.D.   On: 02/15/2019 14:15   Dg Chest Portable 1 View  Result Date: 02/15/2019 CLINICAL DATA:  Altered mental status and weakness EXAM: PORTABLE CHEST 1 VIEW COMPARISON:  03/21/2018 FINDINGS: The heart size and  mediastinal contours are within normal limits. Both lungs are clear. The visualized skeletal structures are unremarkable. IMPRESSION: No active disease. Electronically Signed   By: Alcide Clever M.D.   On: 02/15/2019 13:18   Ct Head Code Stroke Wo Contrast  Result Date: 02/15/2019 CLINICAL DATA:  Code stroke.  Dizziness.  Right-sided weakness. EXAM: CT HEAD WITHOUT CONTRAST TECHNIQUE: Contiguous axial images were obtained from the base of the skull through the vertex without intravenous contrast. COMPARISON:  03/29/2018 FINDINGS: Brain: Normal appearance without evidence of old or acute infarction, mass lesion, hemorrhage, hydrocephalus or extra-axial collection. Vascular: No abnormal vascular finding. Skull: Normal Sinuses/Orbits: Clear/normal Other: None ASPECTS (Alberta Stroke Program Early CT Score) - Ganglionic level infarction (caudate, lentiform nuclei, internal capsule, insula, M1-M3 cortex): 7 - Supraganglionic infarction (M4-M6 cortex): 3 Total score (0-10 with 10 being normal): 10 IMPRESSION: 1. Normal head CT 2. ASPECTS is 10 3. These results were communicated to Dr. Otelia Limes at 12:35 pmon 10/22/2020by text page via the  [facility] messaging system. Electronically Signed   By: Paulina Fusi M.D.   On: 02/15/2019 12:35    EKG: Independently reviewed.  Normal sinus rhythm , Qtc 447 ms     Assessment and Plan:   Active Problems:   TIA (transient ischemic attack)   HTN (hypertension)   Migraine   Depression    1.  Right-sided weakness: TIA versus small left coronary radiata CVA.  Patient symptoms are completely resolved now.  Patient advised to quit smoking.  Lipid profile in a.m.  Will admit with aspirin/high intensity statins.  Diet if clears bedside swallow evaluation.  Since MRA of the head was obtained but MRA neck not done, will obtain carotid ultrasonogram to rule out carotid stenosis.  PT/OT evaluation per stroke protocol.   2.  Hypertension: Hold HCTZ and resume Coreg at lower dose for permissive hypertension.  SBP in the 110s currently.  She did take her morning meds today.  Check orthostatics given complaints of dizziness at onset of symptoms.  3.  Migraine headaches:  Although possibility of complex migraine was entertained by neurology, patient denies any complaints of headache or aura today.  Resume Lamictal  4.  Depression/mood disorder: Resume home medications including Zoloft  5.  Tobacco use: Counseled extensively regarding long-term risks including recurrent strokes.  Patient verbalized understanding.  Nicotine patch was offered  DVT prophylaxis: Lovenox  COVID screen: Negative  Code Status: Full code.Health care proxy would be her daughter Revonda Standard who can be reached at [phone]  Patient/Family Communication: Discussed with patient and all questions answered to satisfaction.  Consults called: Neurology Admission status : Will admit to observation status as anticipated length of stay less than 2 midnights.  Can likely be discharged in a.m. as symptoms have completely resolved. Expected LOS: 1 day    Alessandra Bevels MD [facility] Pager [phone]  If 7PM-7AM, please  contact night-coverage [email or web address] Password [facility]  02/15/2019, 6:56 PM

## 2019-02-15 NOTE — Consult Note (Addendum)
Neurology Consultation  Reason for Consult: Code stroke with sensation of right-sided arm and leg heaviness   Referring Physician: Dr. Alvino Chapel  History is obtained from: Patient  HPI: Heather Gomez is a 53 y.o. female with history of migraines, kidney stones, hypertension and depression.  Patient states that she was sitting at her desk today when at about 11:00 she felt as though her right leg started feeling heavy which then migrated to her right arm.  She tried to pick up a glass with her right hand and felt as though she could not control her right hand.  She tried to get up but had to hold onto her desk as she felt as though her right leg could not support her.  EMS was called.  She does feel at this point that she is improving but still feels as though her right arm and leg have a heavy sensation.  While getting onto the CT table she did state that she was feeling a possible headache coming on but did not actually develop a headache.  She does have a history of migraine headaches.  Recently she has been having approximately 3-4 headaches a week, which is a significant increase in frequency from what is usually approximately 3 migraine headaches a month.  She cannot give me a description of what her typical migraine headache feels like.  She often times uses NSAIDs to treat her migraine headaches.  She has never had neurological symptoms with her migraine headaches, including auras.  Patient admits that she is a heavy smoker and does not take an aspirin on a daily basis.   LKW: 11 AM tpa given?: no, NIH stroke scale 0 Premorbid modified Rankin scale (mRS): 0 NIH stroke scale of 0   ROS: A 14 point ROS was performed and is negative except as noted in the HPI.   Past Medical History:  Diagnosis Date  . Depression   . Hypertension   . Kidney stones   . Migraines     Family History  Problem Relation Age of Onset  . Hypertension Mother   . Hypertension Father    Social  History:   reports that she has been smoking cigarettes. She has been smoking about 1.00 pack per day. She has never used smokeless tobacco. She reports that she does not drink alcohol or use drugs.  Medications  Current Facility-Administered Medications:  .  sodium chloride flush (NS) 0.9 % injection 3 mL, 3 mL, Intravenous, Once, Davonna Belling, MD  Current Outpatient Medications:  .  carvedilol (COREG) 12.5 MG tablet, Take 12.5 mg by mouth 2 (two) times daily with a meal., Disp: , Rfl:  .  cephALEXin (KEFLEX) 500 MG capsule, Take 1 capsule (500 mg total) by mouth 4 (four) times daily., Disp: 40 capsule, Rfl: 0 .  cyclobenzaprine (FLEXERIL) 10 MG tablet, Take 10 mg by mouth 3 (three) times daily as needed., Disp: , Rfl:  .  eletriptan (RELPAX) 40 MG tablet, One tablet by mouth at onset of headache. May repeat in 2 hours if headache persists or recurs. may repeat in 2 hours if necessary, Disp: , Rfl:  .  hydrochlorothiazide (HYDRODIURIL) 25 MG tablet, Take 25 mg by mouth daily., Disp: , Rfl:  .  hydrocortisone cream 1 %, Apply to affected area 2 times daily, Disp: 15 g, Rfl: 0 .  ibuprofen (ADVIL,MOTRIN) 800 MG tablet, Take 1 tablet (800 mg total) by mouth 3 (three) times daily., Disp: 21 tablet, Rfl: 0 .  lamoTRIgine (LAMICTAL) 100 MG tablet, Take 100 mg by mouth 2 (two) times daily., Disp: , Rfl:  .  ondansetron (ZOFRAN ODT) 4 MG disintegrating tablet, Take 1 tablet (4 mg total) by mouth every 8 (eight) hours as needed for nausea or vomiting., Disp: 20 tablet, Rfl: 0 .  promethazine (PHENERGAN) 25 MG tablet, Take 25 mg by mouth every 6 (six) hours as needed., Disp: , Rfl:  .  sertraline (ZOLOFT) 100 MG tablet, Take 100 mg by mouth daily., Disp: , Rfl:    Exam: Current vital signs: Wt 88 kg   LMP  (LMP Unknown)   BMI 32.28 kg/m  Vital signs in last 24 hours: Weight:  [88 kg] 88 kg (10/22 1200)  Physical Exam  General ROS: negative for - chills, fatigue, fever, night sweats,  weight gain or weight loss Psychological ROS: negative for - behavioral disorder, hallucinations, memory difficulties, mood swings or suicidal ideation Ophthalmic ROS: negative for - blurry vision, double vision, eye pain or loss of vision ENT ROS: negative for - epistaxis, nasal discharge, oral lesions, sore throat, tinnitus or vertigo Respiratory ROS: negative for - cough, hemoptysis, shortness of breath or wheezing Cardiovascular ROS: negative for - chest pain, dyspnea on exertion, edema or irregular heartbeat Gastrointestinal ROS: negative for - abdominal pain, diarrhea, hematemesis, nausea/vomiting or stool incontinence Genito-Urinary ROS: negative for - dysuria, hematuria, incontinence or urinary frequency/urgency Musculoskeletal ROS: Positive for -sensation of heaviness in the right arm and leg Neurological ROS: as noted in HPI Dermatological ROS: negative for rash and skin lesion changes    Neuro: Mental Status: Patient is awake, alert, oriented to person, place, month, year, and situation. Patient is able to give a clear and coherent history. No signs of aphasia or neglect Cranial Nerves: II: Visual Fields are full.  III,IV, VI: EOMI without ptosis or diplopia. Pupils equal, round and reactive to light V: Facial sensation is symmetric to temperature VII: Facial movement is symmetric.  VIII: hearing is intact to voice X: Palate elevates symmetrically XI: Shoulder shrug is symmetric. XII: tongue is midline without atrophy or fasciculations.  Motor: Tone is normal. Bulk is normal. 5/5 strength was present in all four extremities.  Sensory: Sensation is symmetric to light touch and temperature in the arms and legs. Deep Tendon Reflexes: 2+ and symmetric in the biceps and patellae.  Plantars: Toes are downgoing bilaterally.  Cerebellar: FNF and HKS are intact bilaterally  Labs I have reviewed labs in epic and the results pertinent to this consultation are:   CBC     Component Value Date/Time   WBC 10.4 02/15/2019 1219   RBC 4.42 02/15/2019 1219   HGB 15.6 (H) 02/15/2019 1225   HCT 46.0 02/15/2019 1225   PLT 220 02/15/2019 1219   MCV 99.8 02/15/2019 1219   MCH 34.4 (H) 02/15/2019 1219   MCHC 34.5 02/15/2019 1219   RDW 11.9 02/15/2019 1219   LYMPHSABS 2.4 02/15/2019 1219   MONOABS 1.0 02/15/2019 1219   EOSABS 0.2 02/15/2019 1219   BASOSABS 0.1 02/15/2019 1219    CMP     Component Value Date/Time   NA 138 02/15/2019 1225   K 3.9 02/15/2019 1225   CL 99 02/15/2019 1225   CO2 26 07/16/2018 1512   GLUCOSE 84 02/15/2019 1225   BUN 13 02/15/2019 1225   CREATININE 0.90 02/15/2019 1225   CALCIUM 9.9 07/16/2018 1512   PROT 8.1 07/16/2018 1512   ALBUMIN 4.3 07/16/2018 1512   AST 20 07/16/2018 1512  ALT 15 07/16/2018 1512   ALKPHOS 94 07/16/2018 1512   BILITOT 0.6 07/16/2018 1512   GFRNONAA >60 07/16/2018 1512   GFRAA >60 07/16/2018 1512    Lipid Panel  No results found for: CHOL, TRIG, HDL, CHOLHDL, VLDL, LDLCALC, LDLDIRECT   Imaging I have reviewed the images obtained:  CT-scan of the brain-normal head CT scan  MRI examination of the brain-pending  Felicie Morn PA-C [facility] [phone] 02/15/2019, 12:36 PM   Assessment: 53 year old female with history of migraine headaches which have increased over the last week.  Patient arrives to [facility] as a code stroke secondary to having a sensation of heaviness in her arm and leg which is improving.   1. Given the fact that she is a heavy smoker and does have stroke risk factors this very well could be a TIA in which symptoms improved over time.  2. The other primary differential diagnostic consideration would be new occurrence of complex migraine headache in the context of her history of migraine headaches.     Recommendations: # MRI of the brain without contrast # MRA Head and neck  # Transthoracic Echo,  # Start patient on ASA 325 mg daily  # BP goal:  permissive HTN upto 220/120 mmHg # HBAIC and Lipid profile # Telemetry monitoring # Frequent neuro checks # NPO until passes stroke swallow screen # please page stroke NP  Or  PA  Or MD from 8am -4 pm  as this patient from this time will be  followed by the stroke.   You can look them up on [email or web address]  Password [email or web address]  I have seen and examined the patient. I have formulated the assessment and recommendations. My exam findings were observed and documented by Felicie Morn, PA.  Electronically signed: Dr. Caryl Pina

## 2019-02-15 NOTE — Code Documentation (Signed)
53 yo female presented to [facility] via [facility]. LKW 1120 standing while at work. C/o dizziness and R sided weakness. EMS called- noted slight gait difficulty and R sided weakness. Pt stated she had difficulty opening her coke, "felt like lead". Ct completed. NIHSS 0. TIA alert. Plan for MRI. Bedside handoff with ED RN Luellen Pucker.

## 2019-02-15 NOTE — ED Notes (Signed)
Xray tech at bedside.

## 2019-02-15 NOTE — ED Provider Notes (Signed)
[facility] EMERGENCY DEPARTMENT Provider Note   CSN: [id number] Arrival date & time: 02/15/19  1214  An emergency department physician performed an initial assessment on this suspected stroke patient at 1216(Dr. Alvino Chapel).  History   Chief Complaint Chief Complaint  Patient presents with   Weakness    HPI Heather Gomez is a 53 y.o. female.     HPI Patient came in as a code stroke.  Met at the bridge by myself and neurology.  Reportedly was normal at least 1111.  Then began to have trouble moving her right side.  States started on her leg and went up to the arm.  States her leg felt really heavy in her arm she was not sure how she was unable unscrew the top of her drink.  States she also had a feeling as if she is getting get a migraine but did not get a headache.  No vision changes.  No difficulty speaking.  No fevers or chills.  No cough. Past Medical History:  Diagnosis Date   Depression    Hypertension    Kidney stones    Migraines     There are no active problems to display for this patient.   Past Surgical History:  Procedure Laterality Date   LITHOTRIPSY     URETERAL STENT PLACEMENT     WISDOM TOOTH EXTRACTION       OB History   No obstetric history on file.      Home Medications    Prior to Admission medications   Medication Sig Start Date End Date Taking? Authorizing Provider  carvedilol (COREG) 12.5 MG tablet Take 12.5 mg by mouth 2 (two) times daily with a meal.    [provider]  cephALEXin (KEFLEX) 500 MG capsule Take 1 capsule (500 mg total) by mouth 4 (four) times daily. 07/16/18   Ward, Ozella Almond, PA-C  cyclobenzaprine (FLEXERIL) 10 MG tablet Take 10 mg by mouth 3 (three) times daily as needed.    [provider]  eletriptan (RELPAX) 40 MG tablet One tablet by mouth at onset of headache. May repeat in 2 hours if headache persists or recurs. may repeat in 2 hours if necessary    [provider]  hydrochlorothiazide (HYDRODIURIL) 25 MG tablet Take 25 mg by mouth daily.    [provider]  hydrocortisone cream 1 % Apply to affected area 2 times daily 01/03/14   Dahlia Bailiff, PA-C  ibuprofen (ADVIL,MOTRIN) 800 MG tablet Take 1 tablet (800 mg total) by mouth 3 (three) times daily. 01/03/14   Dahlia Bailiff, PA-C  lamoTRIgine (LAMICTAL) 100 MG tablet Take 100 mg by mouth 2 (two) times daily.    [provider]  ondansetron (ZOFRAN ODT) 4 MG disintegrating tablet Take 1 tablet (4 mg total) by mouth every 8 (eight) hours as needed for nausea or vomiting. 03/29/18   Long, Wonda Olds, MD  promethazine (PHENERGAN) 25 MG tablet Take 25 mg by mouth every 6 (six) hours as needed.    [provider]  sertraline (ZOLOFT) 100 MG tablet Take 100 mg by mouth daily.    [provider]    Family History Family History  Problem Relation Age of Onset   Hypertension Mother    Hypertension Father     Social History Social History   Tobacco Use   Smoking status: Current Every Day Smoker    Packs/day: 1.00    Types: Cigarettes   Smokeless tobacco: Never Used  Substance Use Topics   Alcohol use: No   Drug use: No     Allergies   Labetalol, Percocet [oxycodone-acetaminophen], and Propranolol   Review of Systems Review of Systems  Constitutional: Negative for appetite change.  HENT: Negative for congestion.   Respiratory: Negative for shortness of breath.   Cardiovascular: Negative for chest pain.  Gastrointestinal: Negative for abdominal pain.  Genitourinary: Negative for flank pain.  Musculoskeletal: Negative for back pain.  Skin: Negative for rash.  Neurological: Positive for weakness. Negative for seizures.  Psychiatric/Behavioral: Negative for confusion.     Physical Exam Updated Vital Signs BP 129/65    Pulse 78    Temp 98.9 F (37.2 C) (Oral)    Resp 15    Ht 5' 5"  (1.651 m)    Wt 88 kg    LMP  (LMP Unknown)    SpO2 96%    BMI  32.28 kg/m   Physical Exam Vitals signs and nursing note reviewed.  HENT:     Head: Atraumatic.  Cardiovascular:     Rate and Rhythm: Normal rate and regular rhythm.  Pulmonary:     Comments: Mildly harsh breath sounds without focal rales or rhonchi. Abdominal:     Tenderness: There is no abdominal tenderness.  Skin:    General: Skin is warm.     Capillary Refill: Capillary refill takes less than 2 seconds.  Neurological:     Mental Status: She is alert and oriented to person, place, and time.     Comments: Face symmetric.  Eye movements intact.  Good grip strength bilaterally.  Finger-nose intact bilaterally.  Normal speech.  Complete NIH scoring done by neurology.      ED Treatments / Results  Labs (all labs ordered are listed, but only abnormal results are displayed) Labs Reviewed  CBC - Abnormal; Notable for the following components:      Result Value   Hemoglobin 15.2 (*)    MCH 34.4 (*)    All other components within normal limits  I-STAT CHEM 8, ED - Abnormal; Notable for the following components:   Calcium, Ion 1.13 (*)    Hemoglobin 15.6 (*)    All other components within normal limits  CBG MONITORING, ED - Abnormal; Notable for the following components:   Glucose-Capillary 104 (*)    All other components within normal limits  SARS CORONAVIRUS 2 (TAT 6-24 HRS)  PROTIME-INR  APTT  DIFFERENTIAL  COMPREHENSIVE METABOLIC PANEL  I-STAT BETA HCG BLOOD, ED ([facility], [facility], [facility] ONLY)    EKG None  Radiology Mr Angio Head Wo Contrast  Result Date: 02/15/2019 CLINICAL DATA:  Sudden onset right leg and right arm weakness and numbness. EXAM: MRI HEAD WITHOUT CONTRAST MRA HEAD WITHOUT CONTRAST TECHNIQUE: Multiplanar, multiecho pulse sequences of the brain and surrounding structures were obtained without intravenous contrast. Angiographic images of the head were obtained using MRA technique without contrast. COMPARISON:  Head CT same day FINDINGS: MRI HEAD FINDINGS Brain:  Diffusion imaging does not show any definite infarction. One could question very minimal restricted diffusion in the coronal radiography on the left which could indicate a minor white matter insult. Elsewhere, the brainstem and cerebellum are normal. Cerebral hemispheres show mild chronic small-vessel ischemic change of the white matter in the frontal region, which is advanced for age. No cortical or large vessel territory infarction. No mass lesion, hemorrhage, hydrocephalus or extra-axial collection. Vascular: Major vessels at the base of the brain show flow. Skull and upper cervical spine: Negative  Sinuses/Orbits: Clear/normal Other: None MRA HEAD FINDINGS Both internal carotid arteries are widely patent into the brain. No siphon stenosis. The anterior and middle cerebral vessels are patent without proximal stenosis, aneurysm or vascular malformation. Both vertebral arteries are widely patent to the basilar. No basilar stenosis. Posterior circulation branch vessels appear normal. IMPRESSION: 1. Mild chronic small-vessel change of the hemispheric white matter, more than should be seen at this age. Diffusion imaging raises the question of a tiny acute infarction in the corona radiata on the left. I do not think this is definite, but is suspicious. 2. MRA head: No large or medium vessel occlusion or correctable proximal stenosis. Electronically Signed   By: Nelson Chimes M.D.   On: 02/15/2019 14:15   Mr Brain Wo Contrast  Result Date: 02/15/2019 CLINICAL DATA:  Sudden onset right leg and right arm weakness and numbness. EXAM: MRI HEAD WITHOUT CONTRAST MRA HEAD WITHOUT CONTRAST TECHNIQUE: Multiplanar, multiecho pulse sequences of the brain and surrounding structures were obtained without intravenous contrast. Angiographic images of the head were obtained using MRA technique without contrast. COMPARISON:  Head CT same day FINDINGS: MRI HEAD FINDINGS Brain: Diffusion imaging does not show any definite infarction.  One could question very minimal restricted diffusion in the coronal radiography on the left which could indicate a minor white matter insult. Elsewhere, the brainstem and cerebellum are normal. Cerebral hemispheres show mild chronic small-vessel ischemic change of the white matter in the frontal region, which is advanced for age. No cortical or large vessel territory infarction. No mass lesion, hemorrhage, hydrocephalus or extra-axial collection. Vascular: Major vessels at the base of the brain show flow. Skull and upper cervical spine: Negative Sinuses/Orbits: Clear/normal Other: None MRA HEAD FINDINGS Both internal carotid arteries are widely patent into the brain. No siphon stenosis. The anterior and middle cerebral vessels are patent without proximal stenosis, aneurysm or vascular malformation. Both vertebral arteries are widely patent to the basilar. No basilar stenosis. Posterior circulation branch vessels appear normal. IMPRESSION: 1. Mild chronic small-vessel change of the hemispheric white matter, more than should be seen at this age. Diffusion imaging raises the question of a tiny acute infarction in the corona radiata on the left. I do not think this is definite, but is suspicious. 2. MRA head: No large or medium vessel occlusion or correctable proximal stenosis. Electronically Signed   By: Nelson Chimes M.D.   On: 02/15/2019 14:15   Dg Chest Portable 1 View  Result Date: 02/15/2019 CLINICAL DATA:  Altered mental status and weakness EXAM: PORTABLE CHEST 1 VIEW COMPARISON:  03/21/2018 FINDINGS: The heart size and mediastinal contours are within normal limits. Both lungs are clear. The visualized skeletal structures are unremarkable. IMPRESSION: No active disease. Electronically Signed   By: Inez Catalina M.D.   On: 02/15/2019 13:18   Ct Head Code Stroke Wo Contrast  Result Date: 02/15/2019 CLINICAL DATA:  Code stroke.  Dizziness.  Right-sided weakness. EXAM: CT HEAD WITHOUT CONTRAST TECHNIQUE:  Contiguous axial images were obtained from the base of the skull through the vertex without intravenous contrast. COMPARISON:  03/29/2018 FINDINGS: Brain: Normal appearance without evidence of old or acute infarction, mass lesion, hemorrhage, hydrocephalus or extra-axial collection. Vascular: No abnormal vascular finding. Skull: Normal Sinuses/Orbits: Clear/normal Other: None ASPECTS ([place] Stroke Program Early CT Score) - Ganglionic level infarction (caudate, lentiform nuclei, internal capsule, insula, M1-M3 cortex): 7 - Supraganglionic infarction (M4-M6 cortex): 3 Total score (0-10 with 10 being normal): 10 IMPRESSION: 1. Normal head CT 2. ASPECTS is  10 3. These results were communicated to Dr. Cheral Marker at 12:35 pmon 10/22/2020by text page via the [facility] messaging system. Electronically Signed   By: Nelson Chimes M.D.   On: 02/15/2019 12:35    Procedures Procedures (including critical care time)  Medications Ordered in ED Medications  sodium chloride flush (NS) 0.9 % injection 3 mL (has no administration in time range)     Initial Impression / Assessment and Plan / ED Course  I have reviewed the triage vital signs and the nursing notes.  Pertinent labs & imaging results that were available during my care of the patient were reviewed by me and considered in my medical decision making (see chart for details).        Patient came in as stroke.  Had had right-sided weakness.  However symptoms improved.  Benign exam now.  Has had migraines previously and felt as if she may get a headache.  Initial head CT reassuring.  MRI showed possible artifact versus small stroke.  However not a TPA candidate due to improvement of symptoms.  Will admit to hospitalist.  Final Clinical Impressions(s) / ED Diagnoses   Final diagnoses:  Weakness    ED Discharge Orders    None       Davonna Belling, MD 02/15/19 1422

## 2019-02-15 NOTE — Progress Notes (Signed)
Patient arrived to unit, AOx4. No complaints. NPO, passed swallow, MD notified.

## 2019-02-15 NOTE — ED Notes (Signed)
Patient CBG was 104.

## 2019-02-15 NOTE — ED Notes (Signed)
ED TO INPATIENT HANDOFF REPORT  ED Nurse Name and Phone #: Mickeal Skinner [phone]  S Name/Age/Gender Heather Gomez 53 y.o. female Room/Bed: 041C/041C  Code Status   Code Status: Full Code  Home/SNF/Other Home Patient oriented to: self, place, time and situation Is this baseline? Yes   Triage Complete: Triage complete  Chief Complaint STROKE  Triage Note Pt reports at 11AM today she felt dizzy and Pt reported the Lt  Leg and Lt  Arm felt like lead. Pt reports a Hx of migraine HA and had 3 or more last week. Pt reports she did not have a migraine today. EMS vitals BP 188/88 . HR 88, CBG 105 .   Allergies Allergies  Allergen Reactions  . Labetalol Nausea And Vomiting  . Percocet [Oxycodone-Acetaminophen] Nausea And Vomiting  . Propranolol Other (See Comments)    "gives me migraines"    Level of Care/Admitting Diagnosis ED Disposition    ED Disposition Condition Comment   Admit  Hospital Area: [facility] [[id number]]  Level of Care: Telemetry Medical [104]  I expect the patient will be discharged within 24 hours: Yes  LOW acuity---Tx typically complete <24 hrs---ACUTE conditions typically can be evaluated <24 hours---LABS likely to return to acceptable levels <24 hours---IS near functional baseline---EXPECTED to return to current living arrangement---NOT newly hypoxic: Meets criteria for 5C-Observation unit  Covid Evaluation: Asymptomatic Screening Protocol (No Symptoms)  Diagnosis: TIA (transient ischemic attack) [500938]  Admitting Physician: Alessandra Bevels [[id number]]  Attending Physician: Alessandra Bevels [[id number]]  PT Class (Do Not Modify): Observation [104]  PT Acc Code (Do Not Modify): Observation [10022]       B Medical/Surgery History Past Medical History:  Diagnosis Date  . Depression   . Hypertension   . Kidney stones   . Migraines    Past Surgical History:  Procedure Laterality Date  . LITHOTRIPSY    . URETERAL STENT PLACEMENT     . WISDOM TOOTH EXTRACTION       A IV Location/Drains/Wounds Patient Lines/Drains/Airways Status   Active Line/Drains/Airways    Name:   Placement date:   Placement time:   Site:   Days:   Peripheral IV 02/15/19 Right Antecubital   02/15/19    1241    Antecubital   less than 1          Intake/Output Last 24 hours No intake or output data in the 24 hours ending 02/15/19 1753  Labs/Imaging Results for orders placed or performed during the hospital encounter of 02/15/19 (from the past 48 hour(s))  Protime-INR     Status: None   Collection Time: 02/15/19 12:19 PM  Result Value Ref Range   Prothrombin Time 13.0 11.4 - 15.2 seconds   INR 1.0 0.8 - 1.2    Comment: (NOTE) INR goal varies based on device and disease states. Performed at [facility], [address], [place], [place] [address]   APTT     Status: None   Collection Time: 02/15/19 12:19 PM  Result Value Ref Range   aPTT 35 24 - 36 seconds    Comment: Performed at [facility], [address] [place], [place] [address]  CBC     Status: Abnormal   Collection Time: 02/15/19 12:19 PM  Result Value Ref Range   WBC 10.4 4.0 - 10.5 K/uL   RBC 4.42 3.87 - 5.11 MIL/uL   Hemoglobin 15.2 (H) 12.0 - 15.0 g/dL   HCT 58.5 27.7 - 82.4 %  MCV 99.8 80.0 - 100.0 fL   MCH 34.4 (H) 26.0 - 34.0 pg   MCHC 34.5 30.0 - 36.0 g/dL   RDW 16.1 09.6 - 04.5 %   Platelets 220 150 - 400 K/uL   nRBC 0.0 0.0 - 0.2 %    Comment: Performed at [facility], [address], [place], [place] [address]  Differential     Status: None   Collection Time: 02/15/19 12:19 PM  Result Value Ref Range   Neutrophils Relative % 63 %   Neutro Abs 6.7 1.7 - 7.7 K/uL   Lymphocytes Relative 23 %   Lymphs Abs 2.4 0.7 - 4.0 K/uL   Monocytes Relative 10 %   Monocytes Absolute 1.0 0.1 - 1.0 K/uL   Eosinophils Relative 2 %   Eosinophils Absolute 0.2 0.0 - 0.5 K/uL   Basophils Relative 1 %   Basophils Absolute 0.1 0.0 - 0.1 K/uL   Immature  Granulocytes 1 %   Abs Immature Granulocytes 0.06 0.00 - 0.07 K/uL    Comment: Performed at [facility], [address], [place], [place] [address]  Comprehensive metabolic panel     Status: None   Collection Time: 02/15/19 12:19 PM  Result Value Ref Range   Sodium 139 135 - 145 mmol/L   Potassium 4.0 3.5 - 5.1 mmol/L   Chloride 101 98 - 111 mmol/L   CO2 27 22 - 32 mmol/L   Glucose, Bld 86 70 - 99 mg/dL   BUN 11 6 - 20 mg/dL   Creatinine, Ser 8.29 0.44 - 1.00 mg/dL   Calcium 9.6 8.9 - 56.2 mg/dL   Total Protein 7.5 6.5 - 8.1 g/dL   Albumin 3.9 3.5 - 5.0 g/dL   AST 24 15 - 41 U/L   ALT 18 0 - 44 U/L   Alkaline Phosphatase 99 38 - 126 U/L   Total Bilirubin 0.9 0.3 - 1.2 mg/dL   GFR calc non Af Amer >60 >60 mL/min   GFR calc Af Amer >60 >60 mL/min   Anion gap 11 5 - 15    Comment: Performed at [facility], [address] [place], [place] [address]  I-Stat beta hCG blood, ED     Status: None   Collection Time: 02/15/19 12:23 PM  Result Value Ref Range   I-stat hCG, quantitative <5.0 <5 mIU/mL   Comment 3            Comment:   GEST. AGE      CONC.  (mIU/mL)   <=1 WEEK        5 - 50     2 WEEKS       50 - 500     3 WEEKS       100 - 10,000     4 WEEKS     1,000 - 30,000        FEMALE AND NON-PREGNANT FEMALE:     LESS THAN 5 mIU/mL   I-stat chem 8, ED     Status: Abnormal   Collection Time: 02/15/19 12:25 PM  Result Value Ref Range   Sodium 138 135 - 145 mmol/L   Potassium 3.9 3.5 - 5.1 mmol/L   Chloride 99 98 - 111 mmol/L   BUN 13 6 - 20 mg/dL   Creatinine, Ser 5.78 0.44 - 1.00 mg/dL   Glucose, Bld 84 70 - 99 mg/dL   Calcium, Ion 4.69 (L) 1.15 - 1.40 mmol/L   TCO2  29 22 - 32 mmol/L   Hemoglobin 15.6 (H) 12.0 - 15.0 g/dL   HCT 57.846.0 46.936.0 - 62.946.0 %  CBG monitoring, ED     Status: Abnormal   Collection Time: 02/15/19 12:46 PM  Result Value Ref Range   Glucose-Capillary 104 (H) 70 - 99 mg/dL   Comment 1 Notify RN    Comment 2 Document in Chart    Mr Angio  Head Wo Contrast  Result Date: 02/15/2019 CLINICAL DATA:  Sudden onset right leg and right arm weakness and numbness. EXAM: MRI HEAD WITHOUT CONTRAST MRA HEAD WITHOUT CONTRAST TECHNIQUE: Multiplanar, multiecho pulse sequences of the brain and surrounding structures were obtained without intravenous contrast. Angiographic images of the head were obtained using MRA technique without contrast. COMPARISON:  Head CT same day FINDINGS: MRI HEAD FINDINGS Brain: Diffusion imaging does not show any definite infarction. One could question very minimal restricted diffusion in the coronal radiography on the left which could indicate a minor white matter insult. Elsewhere, the brainstem and cerebellum are normal. Cerebral hemispheres show mild chronic small-vessel ischemic change of the white matter in the frontal region, which is advanced for age. No cortical or large vessel territory infarction. No mass lesion, hemorrhage, hydrocephalus or extra-axial collection. Vascular: Major vessels at the base of the brain show flow. Skull and upper cervical spine: Negative Sinuses/Orbits: Clear/normal Other: None MRA HEAD FINDINGS Both internal carotid arteries are widely patent into the brain. No siphon stenosis. The anterior and middle cerebral vessels are patent without proximal stenosis, aneurysm or vascular malformation. Both vertebral arteries are widely patent to the basilar. No basilar stenosis. Posterior circulation branch vessels appear normal. IMPRESSION: 1. Mild chronic small-vessel change of the hemispheric white matter, more than should be seen at this age. Diffusion imaging raises the question of a tiny acute infarction in the corona radiata on the left. I do not think this is definite, but is suspicious. 2. MRA head: No large or medium vessel occlusion or correctable proximal stenosis. Electronically Signed   By: Paulina FusiMark  Shogry M.D.   On: 02/15/2019 14:15   Mr Brain Wo Contrast  Result Date: 02/15/2019 CLINICAL  DATA:  Sudden onset right leg and right arm weakness and numbness. EXAM: MRI HEAD WITHOUT CONTRAST MRA HEAD WITHOUT CONTRAST TECHNIQUE: Multiplanar, multiecho pulse sequences of the brain and surrounding structures were obtained without intravenous contrast. Angiographic images of the head were obtained using MRA technique without contrast. COMPARISON:  Head CT same day FINDINGS: MRI HEAD FINDINGS Brain: Diffusion imaging does not show any definite infarction. One could question very minimal restricted diffusion in the coronal radiography on the left which could indicate a minor white matter insult. Elsewhere, the brainstem and cerebellum are normal. Cerebral hemispheres show mild chronic small-vessel ischemic change of the white matter in the frontal region, which is advanced for age. No cortical or large vessel territory infarction. No mass lesion, hemorrhage, hydrocephalus or extra-axial collection. Vascular: Major vessels at the base of the brain show flow. Skull and upper cervical spine: Negative Sinuses/Orbits: Clear/normal Other: None MRA HEAD FINDINGS Both internal carotid arteries are widely patent into the brain. No siphon stenosis. The anterior and middle cerebral vessels are patent without proximal stenosis, aneurysm or vascular malformation. Both vertebral arteries are widely patent to the basilar. No basilar stenosis. Posterior circulation branch vessels appear normal. IMPRESSION: 1. Mild chronic small-vessel change of the hemispheric white matter, more than should be seen at this age. Diffusion imaging raises the question of a tiny acute  infarction in the corona radiata on the left. I do not think this is definite, but is suspicious. 2. MRA head: No large or medium vessel occlusion or correctable proximal stenosis. Electronically Signed   By: Paulina Fusi M.D.   On: 02/15/2019 14:15   Dg Chest Portable 1 View  Result Date: 02/15/2019 CLINICAL DATA:  Altered mental status and weakness EXAM:  PORTABLE CHEST 1 VIEW COMPARISON:  03/21/2018 FINDINGS: The heart size and mediastinal contours are within normal limits. Both lungs are clear. The visualized skeletal structures are unremarkable. IMPRESSION: No active disease. Electronically Signed   By: Alcide Clever M.D.   On: 02/15/2019 13:18   Ct Head Code Stroke Wo Contrast  Result Date: 02/15/2019 CLINICAL DATA:  Code stroke.  Dizziness.  Right-sided weakness. EXAM: CT HEAD WITHOUT CONTRAST TECHNIQUE: Contiguous axial images were obtained from the base of the skull through the vertex without intravenous contrast. COMPARISON:  03/29/2018 FINDINGS: Brain: Normal appearance without evidence of old or acute infarction, mass lesion, hemorrhage, hydrocephalus or extra-axial collection. Vascular: No abnormal vascular finding. Skull: Normal Sinuses/Orbits: Clear/normal Other: None ASPECTS (Alberta Stroke Program Early CT Score) - Ganglionic level infarction (caudate, lentiform nuclei, internal capsule, insula, M1-M3 cortex): 7 - Supraganglionic infarction (M4-M6 cortex): 3 Total score (0-10 with 10 being normal): 10 IMPRESSION: 1. Normal head CT 2. ASPECTS is 10 3. These results were communicated to Dr. Otelia Limes at 12:35 pmon 10/22/2020by text page via the [facility] messaging system. Electronically Signed   By: Paulina Fusi M.D.   On: 02/15/2019 12:35    Pending Labs Unresulted Labs (From admission, onward)    Start     Ordered   02/22/19 0500  Creatinine, serum  (enoxaparin (LOVENOX)    CrCl >/= 30 ml/min)  Weekly,   R    Comments: while on enoxaparin therapy    02/15/19 1614   02/16/19 0500  Hemoglobin A1c  Tomorrow morning,   R     02/15/19 1614   02/16/19 0500  Lipid panel  Tomorrow morning,   R    Comments: Fasting    02/15/19 1614   02/15/19 1612  CBC  (enoxaparin (LOVENOX)    CrCl >/= 30 ml/min)  Once,   STAT    Comments: Baseline for enoxaparin therapy IF NOT ALREADY DRAWN.  Notify MD if PLT < 100 K.    02/15/19 1614   02/15/19 1611  HIV  Antibody (routine testing w rflx)  (HIV Antibody (Routine testing w reflex) panel)  Once,   STAT     02/15/19 1614   02/15/19 1235  SARS CORONAVIRUS 2 (TAT 6-24 HRS) Nasopharyngeal Nasopharyngeal Swab  (Asymptomatic/Tier 2 Patients Labs)  Once,   STAT    Question Answer Comment  Is this test for diagnosis or screening Screening   Symptomatic for COVID-19 as defined by CDC No   Hospitalized for COVID-19 No   Admitted to ICU for COVID-19 No   Previously tested for COVID-19 No   Resident in a congregate (group) care setting No   Employed in healthcare setting No   Pregnant No      02/15/19 1234          Vitals/Pain Today's Vitals   02/15/19 1421 02/15/19 1430 02/15/19 1445 02/15/19 1515  BP: 120/86 114/90 117/89 110/80  Pulse: 78     Resp: Temp:      TempSrc:      SpO2: 100%     Weight:  Height:      PainSc:        Isolation Precautions No active isolations  Medications Medications  sertraline (ZOLOFT) tablet 100 mg (has no administration in time range)  cyclobenzaprine (FLEXERIL) tablet 10 mg (has no administration in time range)  lamoTRIgine (LAMICTAL) tablet 100 mg (has no administration in time range)  multivitamin with minerals tablet 1 tablet (has no administration in time range)  promethazine (PHENERGAN) tablet 25 mg (has no administration in time range)   stroke: mapping our early stages of recovery book (has no administration in time range)  acetaminophen (TYLENOL) tablet 650 mg (has no administration in time range)    Or  acetaminophen (TYLENOL) solution 650 mg (has no administration in time range)    Or  acetaminophen (TYLENOL) suppository 650 mg (has no administration in time range)  enoxaparin (LOVENOX) injection 40 mg (has no administration in time range)  aspirin EC tablet 81 mg (has no administration in time range)  atorvastatin (LIPITOR) tablet 40 mg (has no administration in time range)  carvedilol (COREG) tablet 6.25 mg (has no  administration in time range)  sodium chloride flush (NS) 0.9 % injection 3 mL (3 mLs Intravenous Given 02/15/19 1657)    Mobility walks Low fall risk   Focused Assessments Neuro Assessment Handoff:  Swallow screen pass? Yes    NIH Stroke Scale ( + Modified Stroke Scale Criteria)  Interval: Shift assessment Level of Consciousness (1a.)   : Alert, keenly responsive LOC Questions (1b. )   +: Answers both questions correctly LOC Commands (1c. )   + : Performs both tasks correctly Best Gaze (2. )  +: Normal Visual (3. )  +: No visual loss Facial Palsy (4. )    : Normal symmetrical movements Motor Arm, Left (5a. )   +: No drift Motor Arm, Right (5b. )   +: No drift Motor Leg, Left (6a. )   +: No drift Motor Leg, Right (6b. )   +: No drift Limb Ataxia (7. ): Absent Sensory (8. )   +: Normal, no sensory loss Best Language (9. )   +: No aphasia Dysarthria (10. ): Normal Extinction/Inattention (11.)   +: No Abnormality Modified SS Total  +: 0 Complete NIHSS TOTAL: 0 Last date known well: 02/15/19 Last time known well: 1120 Neuro Assessment: Within Defined Limits Neuro Checks:   Shift assessment (02/15/19 1313)  Last Documented NIHSS Modified Score: 0 (02/15/19 1655) Has TPA been given? No If patient is a Neuro Trauma and patient is going to OR before floor call report to [place] nurse: [phone] or [phone]     R Recommendations: See Admitting Provider Note  Report given to:   Additional Notes:

## 2019-02-16 ENCOUNTER — Observation Stay (HOSPITAL_BASED_OUTPATIENT_CLINIC_OR_DEPARTMENT_OTHER): Payer: BC Managed Care – PPO

## 2019-02-16 DIAGNOSIS — G43909 Migraine, unspecified, not intractable, without status migrainosus: Secondary | ICD-10-CM | POA: Diagnosis not present

## 2019-02-16 DIAGNOSIS — I10 Essential (primary) hypertension: Secondary | ICD-10-CM | POA: Diagnosis not present

## 2019-02-16 DIAGNOSIS — B20 Human immunodeficiency virus [HIV] disease: Secondary | ICD-10-CM

## 2019-02-16 DIAGNOSIS — G459 Transient cerebral ischemic attack, unspecified: Secondary | ICD-10-CM | POA: Diagnosis not present

## 2019-02-16 DIAGNOSIS — E785 Hyperlipidemia, unspecified: Secondary | ICD-10-CM

## 2019-02-16 LAB — HEMOGLOBIN A1C
Hgb A1c MFr Bld: 5.3 % (ref 4.8–5.6)
Mean Plasma Glucose: 105.41 mg/dL

## 2019-02-16 LAB — ECHOCARDIOGRAM COMPLETE
Height: 65 in
Weight: 3104.08 oz

## 2019-02-16 LAB — RAPID URINE DRUG SCREEN, HOSP PERFORMED
Amphetamines: NOT DETECTED
Barbiturates: NOT DETECTED
Benzodiazepines: NOT DETECTED
Cocaine: NOT DETECTED
Opiates: NOT DETECTED
Tetrahydrocannabinol: NOT DETECTED

## 2019-02-16 LAB — LIPID PANEL
Cholesterol: 190 mg/dL (ref 0–200)
HDL: 39 mg/dL — ABNORMAL LOW (ref >40)
LDL Cholesterol: 130 mg/dL — ABNORMAL HIGH (ref 0–99)
Total CHOL/HDL Ratio: 4.9 RATIO
Triglycerides: 107 mg/dL (ref <150)
VLDL: 21 mg/dL (ref 0–40)

## 2019-02-16 LAB — HIV ANTIBODY (ROUTINE TESTING W REFLEX): HIV Screen 4th Generation wRfx: REACTIVE — AB

## 2019-02-16 MED ORDER — ASPIRIN 81 MG PO TBEC: 81.0000 mg | DELAYED_RELEASE_TABLET | Freq: Every day | ORAL | 0 refills | Status: AC

## 2019-02-16 MED ORDER — CLOPIDOGREL BISULFATE 75 MG PO TABS
75.0000 mg | ORAL_TABLET | Freq: Every day | ORAL | Status: DC
  Administered 2019-02-16: 75 mg via ORAL
  Filled 2019-02-16: qty 1

## 2019-02-16 MED ORDER — CLOPIDOGREL BISULFATE 75 MG PO TABS: 75.0000 mg | ORAL_TABLET | Freq: Every day | ORAL | 0 refills | Status: DC

## 2019-02-16 MED ORDER — ATORVASTATIN CALCIUM 40 MG PO TABS: 40.0000 mg | ORAL_TABLET | Freq: Every day | ORAL | 0 refills | Status: DC

## 2019-02-16 MED ORDER — CARVEDILOL 6.25 MG PO TABS: 6.2500 mg | ORAL_TABLET | Freq: Two times a day (BID) | ORAL | 0 refills | Status: DC

## 2019-02-16 MED FILL — ATORVASTATIN 40 MG TABLET: 40 | 30 days supply | Qty: 30 | Fill #0

## 2019-02-16 MED FILL — ASPIRIN 81MG ADULT LOW STRE: 81 | 30 days supply | Qty: 30 | Fill #0

## 2019-02-16 MED FILL — CLOPIDOGREL 75 MG TABLET: 75 | 21 days supply | Qty: 21 | Fill #0

## 2019-02-16 MED FILL — CARVEDILOL 6.25 MG TABLET: 6.25 | 30 days supply | Qty: 60 | Fill #0

## 2019-02-16 NOTE — Evaluation (Signed)
Occupational Therapy Evaluation Patient Details Name: Heather Gomez MRN: [id number] DOB: 1965-07-31 Today's Date: 02/16/2019    History of Present Illness Heather Gomez is a 53 y.o. female with history h/o hypertension, depression, smoking, migraines, kidney stones presents with complaints of acute onset of right-sided weakness. Her symptoms were resolving at the time of evaluation in the ED and completely resolved within 1.5 hours since onset. First MRI of the brain suspicious for tiny acute infarction in corona radiata on the left. However second MRI determined not to be an acute infarct. Working diagnosis of TIA.    Clinical Impression   Pt PTA: Pt performing own ADL, mobility and was currently working. Pt currently performing ADL and functional mobility with no AD and independently. Pt denies need for any additional therapy. Pt with no visual deficits; no sensation deficits. Pt does not require continued OT skilled services. OT signing off.     Follow Up Recommendations  No OT follow up    Equipment Recommendations  None recommended by OT    Recommendations for Other Services       Precautions / Restrictions Precautions Precautions: None Restrictions Weight Bearing Restrictions: No      Mobility Bed Mobility Overal bed mobility: Independent                Transfers Overall transfer level: Independent                    Balance Overall balance assessment: Independent                                         ADL either performed or assessed with clinical judgement   ADL Overall ADL's : Independent                                       General ADL Comments: np physical or cueing requied, Pt appears at baseline.     Vision Baseline Vision/History: No visual deficits Patient Visual Report: No change from baseline Vision Assessment?: No apparent visual deficits     Perception     Praxis       Pertinent Vitals/Pain Pain Assessment: No/denies pain     Hand Dominance Right   Extremity/Trunk Assessment Upper Extremity Assessment Upper Extremity Assessment: Overall WFL for tasks assessed;RUE deficits/detail;LUE deficits/detail RUE Deficits / Details: WNLs LUE Deficits / Details: WNLs   Lower Extremity Assessment Lower Extremity Assessment: Overall WFL for tasks assessed   Cervical / Trunk Assessment Cervical / Trunk Assessment: Normal   Communication Communication Communication: No difficulties   Cognition Arousal/Alertness: Awake/alert Behavior During Therapy: WFL for tasks assessed/performed Overall Cognitive Status: Within Functional Limits for tasks assessed                                     General Comments       Exercises     Shoulder Instructions      Home Living Family/patient expects to be discharged to:: Private residence Living Arrangements: Parent;Children Available Help at Discharge: Family Type of Home: House Home Access: Stairs to enter [employer] of Steps: 4   Home Layout: One level     Bathroom Shower/Tub: Producer, television/film/video: Standard  Home Equipment: None          Prior Functioning/Environment Level of Independence: Independent        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; office job        OT Problem List:        OT Treatment/Interventions:      OT Goals(Current goals can be found in the care plan section) Acute Rehab OT Goals Patient Stated Goal: "go home." OT Goal Formulation: With patient Time For Goal Achievement: 03/02/19 Potential to Achieve Goals: Good  OT Frequency:     Barriers to D/C:            Co-evaluation              AM-PAC OT "6 Clicks" Daily Activity     Outcome Measure Help from another person eating meals?: None Help from another person taking care of personal grooming?: None Help from another person toileting, which includes  using toliet, bedpan, or urinal?: None Help from another person bathing (including washing, rinsing, drying)?: None Help from another person to put on and taking off regular upper body clothing?: None Help from another person to put on and taking off regular lower body clothing?: None 6 Click Score: 24   End of Session Nurse Communication: Mobility status  Activity Tolerance: Patient tolerated treatment well Patient left: in bed;with call bell/phone within reach  OT Visit Diagnosis: Unsteadiness on feet (R26.81)                Time: 3007-6226 OT Time Calculation (min): 13 min Charges:  OT General Charges $OT Visit: 1 Visit OT Evaluation $OT Eval Moderate Complexity: 1 Mod  Darryl Nestle) Marsa Aris OTR/L Acute Rehabilitation Services Pager: [phone] Office: [place] 02/16/2019, 1:21 PM

## 2019-02-16 NOTE — Progress Notes (Signed)
Carotid duplex       has been completed. Preliminary results can be found under CV proc through chart review. Chelbie Jarnagin, BS, RDMS, RVT   

## 2019-02-16 NOTE — Progress Notes (Signed)
  Echocardiogram 2D Echocardiogram has been performed.  Heather Gomez G Lorretta Kerce 02/16/2019, 10:45 AM

## 2019-02-16 NOTE — TOC Transition Note (Signed)
Transition of Care [facility]) - CM/SW Discharge Note   Patient Details  Name: Heather Gomez MRN: [id number] Date of Birth: October 31, 1965  [facility]) CM/SW Contact:  Pollie Friar, RN Phone Number: 02/16/2019, 2:15 PM   Clinical Narrative:    Pt discharging home with self care. No f/u per PT/OT and no DME needs.  Pt has transportation home.   Final next level of care: Home/Self Care Barriers to Discharge: No Barriers Identified   Patient Goals and CMS Choice        Discharge Placement                       Discharge Plan and Services   Discharge Planning Services: CM Consult                                 Social Determinants of Health (SDOH) Interventions     Readmission Risk Interventions No flowsheet data found.

## 2019-02-16 NOTE — TOC Initial Note (Signed)
Transition of Care [facility]) - Initial/Assessment Note    Patient Details  Name: Heather Gomez MRN: [id number] Date of Birth: 13-Aug-1965  [facility]) CM/SW Contact:    Pollie Friar, RN Phone Number: 02/16/2019, 11:29 AM  Clinical Narrative:                 Pt lives at home with family that can provide supervision. She denies issues with home medications or with transportation. No f/u per PT. Awaiting OT eval. TOC following.  Expected Discharge Plan: Home/Self Care Barriers to Discharge: Continued Medical Work up   Patient Goals and CMS Choice        Expected Discharge Plan and Services Expected Discharge Plan: Home/Self Care   Discharge Planning Services: CM Consult   Living arrangements for the past 2 months: Single Family Home                                      Prior Living Arrangements/Services Living arrangements for the past 2 months: Single Family Home Lives with:: Adult Children Patient language and need for interpreter reviewed:: Yes Do you feel safe going back to the place where you live?: Yes      Need for Family Participation in Patient Care: No (Comment) Care giver support system in place?: Yes (comment)   Criminal Activity/Legal Involvement Pertinent to Current Situation/Hospitalization: No - Comment as needed  Activities of Daily Living Home Assistive Devices/Equipment: None ADL Screening (condition at time of admission) Patient's cognitive ability adequate to safely complete daily activities?: Yes Is the patient deaf or have difficulty hearing?: No Does the patient have difficulty seeing, even when wearing glasses/contacts?: No Does the patient have difficulty concentrating, remembering, or making decisions?: No Patient able to express need for assistance with ADLs?: Yes Does the patient have difficulty dressing or bathing?: No Independently performs ADLs?: Yes (appropriate for developmental age) Does the patient have  difficulty walking or climbing stairs?: No Weakness of Legs: None Weakness of Arms/Hands: None  Permission Sought/Granted                  Emotional Assessment Appearance:: Appears stated age     Orientation: : Oriented to Self, Oriented to Place, Oriented to  Time, Oriented to Situation   Psych Involvement: No (comment)  Admission diagnosis:  Weakness [R53.1] Patient Active Problem List   Diagnosis Date Noted  . TIA (transient ischemic attack) 02/15/2019  . HTN (hypertension) 02/15/2019  . Migraine 02/15/2019  . Depression 02/15/2019   PCP:  Aura Dials, MD Pharmacy:   [place], [address] [place] [address] Phone: [phone] Fax: [phone]     Social Determinants of Health (SDOH) Interventions    Readmission Risk Interventions No flowsheet data found.

## 2019-02-16 NOTE — Evaluation (Signed)
Physical Therapy Evaluation Patient Details Name: Heather Gomez MRN: [id number] DOB: December 31, 1965 Today's Date: 02/16/2019   History of Present Illness  Heather Gomez is a 53 y.o. female with history h/o hypertension, depression, smoking, migraines, kidney stones presents with complaints of acute onset of right-sided weakness. Her symptoms were resolving at the time of evaluation in the ED and completely resolved within 1.5 hours since onset. First MRI of the brain suspicious for tiny acute infarction in corona radiata on the left. However second MRI determined not to be an acute infarct. Working diagnosis of TIA.   Clinical Impression  Patient evaluated by Physical Therapy with no further acute PT needs identified. Pt reports resolution of symptoms. Pt ambulating hallway distances and negotiating steps independently. Scoring 22/24 on Dynamic Gait Index, indicating she is not at high risk for falls. Education provided re: BEFAST stroke symptoms. All education has been completed and the patient has no further questions. No follow-up Physical Therapy or equipment needs. PT is signing off. Thank you for this referral.    Follow Up Recommendations No PT follow up    Equipment Recommendations  None recommended by PT    Recommendations for Other Services       Precautions / Restrictions Precautions Precautions: None Restrictions Weight Bearing Restrictions: No      Mobility  Bed Mobility Overal bed mobility: Independent                Transfers Overall transfer level: Independent                  Ambulation/Gait Ambulation/Gait assistance: Independent Gait Distance (Feet): 200 Feet Assistive device: None Gait Pattern/deviations: WFL(Within Functional Limits)   Gait velocity interpretation: >2.62 ft/sec, indicative of community ambulatory    Stairs Stairs: Yes Stairs assistance: Modified independent (Device/Increase time) Stair Management: One rail  Right Number of Stairs: 10 General stair comments: step over step pattern  Wheelchair Mobility    Modified Rankin (Stroke Patients Only) Modified Rankin (Stroke Patients Only) Pre-Morbid Rankin Score: No symptoms Modified Rankin: No symptoms     Balance Overall balance assessment: Independent                               Standardized Balance Assessment Standardized Balance Assessment : Dynamic Gait Index   Dynamic Gait Index Level Surface: Normal Change in Gait Speed: Mild Impairment Gait with Horizontal Head Turns: Normal Gait with Vertical Head Turns: Normal Gait and Pivot Turn: Normal Step Over Obstacle: Normal Step Around Obstacles: Normal Steps: Mild Impairment Total Score: 22       Pertinent Vitals/Pain Pain Assessment: No/denies pain    Home Living Family/patient expects to be discharged to:: Private residence Living Arrangements: Parent;Children(mom, sister, granddaughter) Available Help at Discharge: Family Type of Home: House Home Access: Stairs to enter   Technical brewer of Steps: 4 Home Layout: One level        Prior Function Level of Independence: Independent         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; office job     Journalist, newspaper   Dominant Hand: Right    Extremity/Trunk Assessment   Upper Extremity Assessment Upper Extremity Assessment: Overall WFL for tasks assessed    Lower Extremity Assessment Lower Extremity Assessment: Overall WFL for tasks assessed    Cervical / Trunk Assessment Cervical / Trunk Assessment: Normal  Communication   Communication: No difficulties  Cognition Arousal/Alertness: Awake/alert Behavior During  Therapy: WFL for tasks assessed/performed Overall Cognitive Status: Within Functional Limits for tasks assessed                                        General Comments      Exercises     Assessment/Plan    PT Assessment Patent does not need any  further PT services  PT Problem List         PT Treatment Interventions      PT Goals (Current goals can be found in the Care Plan section)  Acute Rehab PT Goals Patient Stated Goal: "go home." PT Goal Formulation: All assessment and education complete, DC therapy    Frequency     Barriers to discharge        Co-evaluation               AM-PAC PT "6 Clicks" Mobility  Outcome Measure Help needed turning from your back to your side while in a flat bed without using bedrails?: None Help needed moving from lying on your back to sitting on the side of a flat bed without using bedrails?: None Help needed moving to and from a bed to a chair (including a wheelchair)?: None Help needed standing up from a chair using your arms (e.g., wheelchair or bedside chair)?: None Help needed to walk in hospital room?: None Help needed climbing 3-5 steps with a railing? : None 6 Click Score: 24    End of Session   Activity Tolerance: Patient tolerated treatment well Patient left: in bed;with call bell/phone within reach Nurse Communication: Mobility status PT Visit Diagnosis: Other symptoms and signs involving the nervous system (R29.898)    Time: 5638-7564 PT Time Calculation (min) (ACUTE ONLY): 14 min   Charges:   PT Evaluation $PT Eval Low Complexity: 1 Low          Laurina Bustle, PT, DPT Acute Rehabilitation Services Pager [phone] Office [phone]   Vanetta Mulders 02/16/2019, 10:17 AM

## 2019-02-16 NOTE — Evaluation (Signed)
Speech Language Pathology Evaluation Patient Details Name: Heather Gomez MRN: [id number] DOB: 1965-08-20 Today's Date: 02/16/2019 Time: 3536-1443 SLP Time Calculation (min) (ACUTE ONLY): 22 min  Problem List:  Patient Active Problem List   Diagnosis Date Noted  . Hyperlipidemia   . TIA (transient ischemic attack) 02/15/2019  . HTN (hypertension) 02/15/2019  . Migraine 02/15/2019  . Depression 02/15/2019   Past Medical History:  Past Medical History:  Diagnosis Date  . Depression   . Hypertension   . Kidney stones   . Migraines    Past Surgical History:  Past Surgical History:  Procedure Laterality Date  . LITHOTRIPSY    . URETERAL STENT PLACEMENT    . WISDOM TOOTH EXTRACTION     HPI:  Ms Heather Gomez, 53y/f, presened to ED with acute onset of right-sided weakness and heavyness and , dizzyness. PMH of hypertension, depression, smoking, migraines, and kidney stones.    Assessment / Plan / Recommendation Clinical Impression  Patient's cognitive and linquistic skills evaluated. She appears to be at baseline. Her daughter reported some slurring of speech at the onset of symptoms, but had resolved by the time she reached the hospital. Pt's speech is found to be 100% intelligible with no dysarthria. She communicates at the conversational level and scored 30/30 on the Mini mental Examiniation. No further speech therapy needs are recommended.     SLP Assessment  SLP Recommendation/Assessment: Patient does not need any further Speech Lanaguage Pathology Services SLP Visit Diagnosis: Cognitive communication deficit (R41.841)    Follow Up Recommendations  None               SLP Evaluation Cognition  Overall Cognitive Status: Within Functional Limits for tasks assessed Arousal/Alertness: Awake/alert Orientation Level: Oriented to person;Oriented to place;Oriented to time;Oriented to situation Attention: Focused;Sustained;Selective Focused Attention: Appears  intact Sustained Attention: Appears intact Selective Attention: Appears intact Memory: Appears intact Immediate Memory Recall: Sock;Blue;Bed Memory Recall Sock: Without Cue Memory Recall Blue: Without Cue Memory Recall Bed: Without Cue Awareness: Appears intact Problem Solving: Appears intact Executive Function: Reasoning;Sequencing;Organizing Reasoning: Appears intact Sequencing: Appears intact Organizing: Appears intact Safety/Judgment: Appears intact       Comprehension  Auditory Comprehension Overall Auditory Comprehension: Appears within functional limits for tasks assessed Yes/No Questions: Within Functional Limits Commands: Within Functional Limits Conversation: Complex Visual Recognition/Discrimination Discrimination: Within Function Limits Reading Comprehension Reading Status: Within funtional limits    Expression Expression Primary Mode of Expression: Verbal Verbal Expression Overall Verbal Expression: Appears within functional limits for tasks assessed Initiation: No impairment Level of Generative/Spontaneous Verbalization: Conversation Repetition: No impairment Naming: No impairment Pragmatics: No impairment Written Expression Dominant Hand: Right Written Expression: Within Functional Limits   Oral / Motor  Oral Motor/Sensory Function Overall Oral Motor/Sensory Function: Within functional limits Motor Speech Overall Motor Speech: Appears within functional limits for tasks assessed Respiration: Within functional limits Phonation: Normal Resonance: Within functional limits Articulation: Within functional limitis Intelligibility: Intelligible Motor Planning: Witnin functional limits Motor Speech Errors: Not applicable   GO                    Charlynne Cousins Naol Ontiveros, MA, CCC-SLP 02/16/2019 1:28 PM

## 2019-02-16 NOTE — Discharge Instructions (Signed)
There is a confirmatory labs test pending-- should be back next week. The infectious disease team will reach out to you if needed.

## 2019-02-16 NOTE — Discharge Summary (Signed)
Physician Discharge Summary  Heather CruelCynthia B Schimek NWG:[id number] DOB: Jan 13, 1966 DOA: 02/15/2019  PCP: Henrine Screwshacker, Robert, MD  Admit date: 02/15/2019 Discharge date: 02/16/2019  Admitted From: home Discharge disposition: home   Recommendations for Outpatient Follow-Up:   1. + HIV antibody screen--- pending is HIV-RNA (Dr. Ninetta LightsHatcher to follow up-- should result mid week next week)   Discharge Diagnosis:   Active Problems:   TIA (transient ischemic attack)   HTN (hypertension)   Migraine   Depression   Hyperlipidemia    Discharge Condition: Improved.  Diet recommendation: Low sodium, heart healthy.  Wound care: None.  Code status: Full.   History of Present Illness:   Heather Gomez is a 53 y.o. female with history h/o hypertension, depression, smoking, migraines, kidney stones presents with complaints of acute onset of right-sided weakness around 11 AM today.  Patient states she was at work and she got up from her desk to reach out to some papers, when she turned around she felt dizzy/lightheaded and heavy in her right side.  Daughter apparently works with her and witnessed the episode when she appeared to be dragging her right side and appeared to be somewhat slurred.  Patient states she sat down in her chair and tried to open the cap of her water bottle but could not use her right arm.  EMS was summoned, her blood pressure was apparently within normal limits.  She was brought to the ED.  Her symptoms were resolving at the time of evaluation in the ED and completely resolved within 1.5 hours since onset.  Patient reports taking 4 baby aspirins last night for flank pain as she felt her kidney stones might be acting up.  Patient reports history of smoking but denies any history of alcohol/drug abuse. ED course: CBC and BMP within normal limits.  Covid testing negative.  MRI of the brain suspicious for tiny acute infarction in the corona radiata on the left.  MRA of the  head unremarkable.   Hospital Course by Problem:   L brain TIA likely but complicated migraine without HA still in DDx  MRI  EXCESSIVE for age chronic small vessel disease. suspicious for tiny infarct L corona radiata   MRA  Unremarkable  Carotid Doppler unremarkable  2D Echo EF 60-65%. No source of embolus   LDL 130- statin added  HgbA1c 5.3  aspirin 81 mg daily and plavix 75 daily for 3 weeks and then ASA alone.   +HIV screening lab  HIV RNA ordered- ID to follow the results  Tobacco abuse -Current heavy smoker -Smoking cessation counseling provided  Hypertension -Stable -BP goal normotensive  Hyperlipidemia -LDL 130, goal < 70 -Continue statin at discharge    Medical Consultants:   neuro   Discharge Exam:   Vitals:   02/16/19 0811 02/16/19 1143  BP: 108/74 94/64  Pulse: 70 77  Resp: 20 20  Temp: 98.1 F (36.7 C) 97.9 F (36.6 C)  SpO2: 94% 96%   Vitals:   02/15/19 2357 02/16/19 0345 02/16/19 0811 02/16/19 1143  BP: 108/71 98/72 108/74 94/64  Pulse: 87 80 70 77  Resp: 16 16 20 20   Temp: 98.4 F (36.9 C) 98.4 F (36.9 C) 98.1 F (36.7 C) 97.9 F (36.6 C)  TempSrc: Oral Oral Oral Oral  SpO2: 95% 95% 94% 96%  Weight:      Height:        General exam: Appears calm and comfortable.    The results of significant  diagnostics from this hospitalization (including imaging, microbiology, ancillary and laboratory) are listed below for reference.     Procedures and Diagnostic Studies:   Mr Angio Head Wo Contrast  Result Date: 02/15/2019 CLINICAL DATA:  Sudden onset right leg and right arm weakness and numbness. EXAM: MRI HEAD WITHOUT CONTRAST MRA HEAD WITHOUT CONTRAST TECHNIQUE: Multiplanar, multiecho pulse sequences of the brain and surrounding structures were obtained without intravenous contrast. Angiographic images of the head were obtained using MRA technique without contrast. COMPARISON:  Head CT same day FINDINGS: MRI HEAD FINDINGS  Brain: Diffusion imaging does not show any definite infarction. One could question very minimal restricted diffusion in the coronal radiography on the left which could indicate a minor white matter insult. Elsewhere, the brainstem and cerebellum are normal. Cerebral hemispheres show mild chronic small-vessel ischemic change of the white matter in the frontal region, which is advanced for age. No cortical or large vessel territory infarction. No mass lesion, hemorrhage, hydrocephalus or extra-axial collection. Vascular: Major vessels at the base of the brain show flow. Skull and upper cervical spine: Negative Sinuses/Orbits: Clear/normal Other: None MRA HEAD FINDINGS Both internal carotid arteries are widely patent into the brain. No siphon stenosis. The anterior and middle cerebral vessels are patent without proximal stenosis, aneurysm or vascular malformation. Both vertebral arteries are widely patent to the basilar. No basilar stenosis. Posterior circulation branch vessels appear normal. IMPRESSION: 1. Mild chronic small-vessel change of the hemispheric white matter, more than should be seen at this age. Diffusion imaging raises the question of a tiny acute infarction in the corona radiata on the left. I do not think this is definite, but is suspicious. 2. MRA head: No large or medium vessel occlusion or correctable proximal stenosis. Electronically Signed   By: Nelson Chimes M.D.   On: 02/15/2019 14:15   Mr Brain Wo Contrast  Result Date: 02/15/2019 CLINICAL DATA:  Rule out stroke. Right leg and arm weakness and numbness EXAM: MRI HEAD WITHOUT CONTRAST TECHNIQUE: Multiplanar, multiecho pulse sequences of the brain and surrounding structures were obtained without intravenous contrast. COMPARISON:  MRI head 02/15/2019 FINDINGS: Coronal and axial diffusion only performed to follow-up previous abnormality. Prior study question a subtle area of restricted diffusion in the left posterior corona radiata. This is  less apparent on the current study and is not felt to be acute infarct based on follow-up. No areas of acute infarction identified. IMPRESSION: No acute infarct or repeat diffusion imaging. Electronically Signed   By: Franchot Gallo M.D.   On: 02/15/2019 20:43   Mr Brain Wo Contrast  Result Date: 02/15/2019 CLINICAL DATA:  Sudden onset right leg and right arm weakness and numbness. EXAM: MRI HEAD WITHOUT CONTRAST MRA HEAD WITHOUT CONTRAST TECHNIQUE: Multiplanar, multiecho pulse sequences of the brain and surrounding structures were obtained without intravenous contrast. Angiographic images of the head were obtained using MRA technique without contrast. COMPARISON:  Head CT same day FINDINGS: MRI HEAD FINDINGS Brain: Diffusion imaging does not show any definite infarction. One could question very minimal restricted diffusion in the coronal radiography on the left which could indicate a minor white matter insult. Elsewhere, the brainstem and cerebellum are normal. Cerebral hemispheres show mild chronic small-vessel ischemic change of the white matter in the frontal region, which is advanced for age. No cortical or large vessel territory infarction. No mass lesion, hemorrhage, hydrocephalus or extra-axial collection. Vascular: Major vessels at the base of the brain show flow. Skull and upper cervical spine: Negative Sinuses/Orbits: Clear/normal Other: None  MRA HEAD FINDINGS Both internal carotid arteries are widely patent into the brain. No siphon stenosis. The anterior and middle cerebral vessels are patent without proximal stenosis, aneurysm or vascular malformation. Both vertebral arteries are widely patent to the basilar. No basilar stenosis. Posterior circulation branch vessels appear normal. IMPRESSION: 1. Mild chronic small-vessel change of the hemispheric white matter, more than should be seen at this age. Diffusion imaging raises the question of a tiny acute infarction in the corona radiata on the left. I  do not think this is definite, but is suspicious. 2. MRA head: No large or medium vessel occlusion or correctable proximal stenosis. Electronically Signed   By: Paulina Fusi M.D.   On: 02/15/2019 14:15   Dg Chest Portable 1 View  Result Date: 02/15/2019 CLINICAL DATA:  Altered mental status and weakness EXAM: PORTABLE CHEST 1 VIEW COMPARISON:  03/21/2018 FINDINGS: The heart size and mediastinal contours are within normal limits. Both lungs are clear. The visualized skeletal structures are unremarkable. IMPRESSION: No active disease. Electronically Signed   By: Alcide Clever M.D.   On: 02/15/2019 13:18   Ct Head Code Stroke Wo Contrast  Result Date: 02/15/2019 CLINICAL DATA:  Code stroke.  Dizziness.  Right-sided weakness. EXAM: CT HEAD WITHOUT CONTRAST TECHNIQUE: Contiguous axial images were obtained from the base of the skull through the vertex without intravenous contrast. COMPARISON:  03/29/2018 FINDINGS: Brain: Normal appearance without evidence of old or acute infarction, mass lesion, hemorrhage, hydrocephalus or extra-axial collection. Vascular: No abnormal vascular finding. Skull: Normal Sinuses/Orbits: Clear/normal Other: None ASPECTS ([place] Stroke Program Early CT Score) - Ganglionic level infarction (caudate, lentiform nuclei, internal capsule, insula, M1-M3 cortex): 7 - Supraganglionic infarction (M4-M6 cortex): 3 Total score (0-10 with 10 being normal): 10 IMPRESSION: 1. Normal head CT 2. ASPECTS is 10 3. These results were communicated to Dr. Otelia Limes at 12:35 pmon 10/22/2020by text page via the [facility] messaging system. Electronically Signed   By: Paulina Fusi M.D.   On: 02/15/2019 12:35   Vas US Carotid (at [facility])  Result Date: 02/16/2019 Carotid Arterial Duplex Study Indications:       CVA. Risk Factors:      Hypertension. Comparison Study:  no prior Performing Technologist: Jeb Levering RDMS, RVT  Examination Guidelines: A complete evaluation includes B-mode imaging, spectral  Doppler, color Doppler, and power Doppler as needed of all accessible portions of each vessel. Bilateral testing is considered an integral part of a complete examination. Limited examinations for reoccurring indications may be performed as noted.  Right Carotid Findings: +----------+--------+--------+--------+------------------+--------+             PSV cm/s EDV cm/s Stenosis Plaque Description Comments  +----------+--------+--------+--------+------------------+--------+  CCA Prox   111      24                                             +----------+--------+--------+--------+------------------+--------+  CCA Distal 94       25                                             +----------+--------+--------+--------+------------------+--------+  ICA Prox   118      24       1-39%    homogeneous                  +----------+--------+--------+--------+------------------+--------+  ICA Distal 59       21                                             +----------+--------+--------+--------+------------------+--------+  ECA        71       17                                             +----------+--------+--------+--------+------------------+--------+ +----------+--------+-------+----------------+-------------------+             PSV cm/s EDV cms Describe         Arm Pressure (mmHG)  +----------+--------+-------+----------------+-------------------+  Subclavian 132              Multiphasic, WNL                      +----------+--------+-------+----------------+-------------------+ +---------+--------+--+--------+--+---------+  Vertebral PSV cm/s 42 EDV cm/s 14 Antegrade  +---------+--------+--+--------+--+---------+  Left Carotid Findings: +----------+--------+--------+--------+------------------+--------+             PSV cm/s EDV cm/s Stenosis Plaque Description Comments  +----------+--------+--------+--------+------------------+--------+  CCA Prox   104      18                                              +----------+--------+--------+--------+------------------+--------+  CCA Distal 114      31                                             +----------+--------+--------+--------+------------------+--------+  ICA Prox   100      23       1-39%    heterogenous                 +----------+--------+--------+--------+------------------+--------+  ICA Distal 75       24                                             +----------+--------+--------+--------+------------------+--------+  ECA        114      14                                             +----------+--------+--------+--------+------------------+--------+ +----------+--------+--------+----------------+-------------------+             PSV cm/s EDV cm/s Describe         Arm Pressure (mmHG)  +----------+--------+--------+----------------+-------------------+  Subclavian 119               Multiphasic, WNL                      +----------+--------+--------+----------------+-------------------+ +---------+--------+--+--------+--+---------+  Vertebral PSV cm/s 37 EDV cm/s 11 Antegrade  +---------+--------+--+--------+--+---------+  Summary: Right Carotid: Velocities in the right ICA are consistent with a 1-39% stenosis. Left Carotid: Velocities in  the left ICA are consistent with a 1-39% stenosis.  *See table(s) above for measurements and observations.     Preliminary      Labs:   Basic Metabolic Panel: Recent Labs  Lab 02/15/19 1219 02/15/19 1225  NA 139 138  K 4.0 3.9  CL 101 99  CO2 27  --   GLUCOSE 86 84  BUN 11 13  CREATININE 0.94 0.90  CALCIUM 9.6  --    GFR Estimated Creatinine Clearance: 79.2 mL/min (by C-G formula based on SCr of 0.9 mg/dL). Liver Function Tests: Recent Labs  Lab 02/15/19 1219  AST 24  ALT 18  ALKPHOS 99  BILITOT 0.9  PROT 7.5  ALBUMIN 3.9   No results for input(s): LIPASE, AMYLASE in the last 168 hours. No results for input(s): AMMONIA in the last 168 hours. Coagulation profile Recent Labs  Lab 02/15/19 1219    INR 1.0    CBC: Recent Labs  Lab 02/15/19 1219 02/15/19 1225  WBC 10.4  --   NEUTROABS 6.7  --   HGB 15.2* 15.6*  HCT 44.1 46.0  MCV 99.8  --   PLT 220  --    Cardiac Enzymes: No results for input(s): CKTOTAL, CKMB, CKMBINDEX, TROPONINI in the last 168 hours. BNP: Invalid input(s): POCBNP CBG: Recent Labs  Lab 02/15/19 1246  GLUCAP 104*   D-Dimer No results for input(s): DDIMER in the last 72 hours. Hgb A1c Recent Labs    02/16/19 0352  HGBA1C 5.3   Lipid Profile Recent Labs    02/16/19 0352  CHOL 190  HDL 39*  LDLCALC 130*  TRIG 107  CHOLHDL 4.9   Thyroid function studies No results for input(s): TSH, T4TOTAL, T3FREE, THYROIDAB in the last 72 hours.  Invalid input(s): FREET3 Anemia work up No results for input(s): VITAMINB12, FOLATE, FERRITIN, TIBC, IRON, RETICCTPCT in the last 72 hours. Microbiology Recent Results (from the past 240 hour(s))  SARS CORONAVIRUS 2 (TAT 6-24 HRS) Nasopharyngeal Nasopharyngeal Swab     Status: None   Collection Time: 02/15/19  1:00 PM   Specimen: Nasopharyngeal Swab  Result Value Ref Range Status   SARS Coronavirus 2 NEGATIVE NEGATIVE Final    Comment: (NOTE) SARS-CoV-2 target nucleic acids are NOT DETECTED. The SARS-CoV-2 RNA is generally detectable in upper and lower respiratory specimens during the acute phase of infection. Negative results do not preclude SARS-CoV-2 infection, do not rule out co-infections with other pathogens, and should not be used as the sole basis for treatment or other patient management decisions. Negative results must be combined with clinical observations, patient history, and epidemiological information. The expected result is Negative. Fact Sheet for Patients: HairSlick.no Fact Sheet for Healthcare Providers: quierodirigir.com This test is not yet approved or cleared by the [place] FDA and  has been authorized for detection  and/or diagnosis of SARS-CoV-2 by FDA under an Emergency Use Authorization (EUA). This EUA will remain  in effect (meaning this test can be used) for the duration of the COVID-19 declaration under Section 56 4(b)(1) of the Act, 21 U.S.C. section 360bbb-3(b)(1), unless the authorization is terminated or revoked sooner. Performed at [facility], [address], [place], [place] [address]      Discharge Instructions:   Discharge Instructions    Diet - low sodium heart healthy   Complete by: As directed    Discharge instructions   Complete by: As directed    aspirin 81 mg daily and plavix 75 daily for 3 weeks  and then ASA alone.   Increase activity slowly   Complete by: As directed      Allergies as of 02/16/2019      Reactions   Labetalol Nausea And Vomiting   Percocet [oxycodone-acetaminophen] Nausea And Vomiting   Propranolol Other (See Comments)   "gives me migraines"      Medication List    STOP taking these medications   hydrochlorothiazide 25 MG tablet Commonly known as: HYDRODIURIL     TAKE these medications   aspirin 81 MG EC tablet Take 1 tablet (81 mg total) by mouth daily. Start taking on: February 17, 2019   atorvastatin 40 MG tablet Commonly known as: LIPITOR Take 1 tablet (40 mg total) by mouth daily at 6 PM.   carvedilol 6.25 MG tablet Commonly known as: COREG Take 1 tablet (6.25 mg total) by mouth 2 (two) times daily with a meal. What changed:   medication strength  how much to take   clopidogrel 75 MG tablet Commonly known as: PLAVIX Take 1 tablet (75 mg total) by mouth daily.   cyclobenzaprine 10 MG tablet Commonly known as: FLEXERIL Take 10 mg by mouth 3 (three) times daily as needed for muscle spasms.   lamoTRIgine 100 MG tablet Commonly known as: LAMICTAL Take 100 mg by mouth 2 (two) times daily.   multivitamin with minerals Tabs tablet Take 1 tablet by mouth daily.   promethazine 25 MG tablet Commonly known as:  PHENERGAN Take 25 mg by mouth every 6 (six) hours as needed for nausea or vomiting.   sertraline 100 MG tablet Commonly known as: ZOLOFT Take 100 mg by mouth at bedtime.      Follow-up Information    Santiago Glad, MD. Schedule an appointment as soon as possible for a visit in 4 week(s).   Specialty: Specialist Contact information: [address] [place] [address] [phone]        Henrine Screws, MD Follow up in 1 week(s).   Specialty: Family Medicine Contact information: [address] [place] [address] [phone]            Time coordinating discharge: 25 min  Signed:  Joseph Art DO  [facility] 02/16/2019, 1:48 PM

## 2019-02-16 NOTE — Progress Notes (Signed)
NURSING PROGRESS NOTE  Heather Gomez [id number] Discharge Data: 02/16/2019 3:57 PM Attending Provider: Geradine Girt, DO [id number], Herbie Baltimore, MD     Heather Gomez discharged per MD order.  Discussed with the patient the After Visit Summary and all questions fully answered. All IV's discontinued with no bleeding noted. All belongings returned to patient for patient to take home.   Last Vital Signs:  Blood pressure 94/64, pulse 77, temperature 97.9 F (36.6 C), temperature source Oral, resp. rate 20, height 5\' 5"  (1.651 m), weight 88 kg, SpO2 96 %.  Discharge Medication List Allergies as of 02/16/2019      Reactions   Labetalol Nausea And Vomiting   Percocet [oxycodone-acetaminophen] Nausea And Vomiting   Propranolol Other (See Comments)   "gives me migraines"      Medication List    STOP taking these medications   hydrochlorothiazide 25 MG tablet Commonly known as: HYDRODIURIL     TAKE these medications   aspirin 81 MG EC tablet Take 1 tablet (81 mg total) by mouth daily. Start taking on: February 17, 2019   atorvastatin 40 MG tablet Commonly known as: LIPITOR Take 1 tablet (40 mg total) by mouth daily at 6 PM.   carvedilol 6.25 MG tablet Commonly known as: COREG Take 1 tablet (6.25 mg total) by mouth 2 (two) times daily with a meal. What changed:   medication strength  how much to take   clopidogrel 75 MG tablet Commonly known as: PLAVIX Take 1 tablet (75 mg total) by mouth daily.   cyclobenzaprine 10 MG tablet Commonly known as: FLEXERIL Take 10 mg by mouth 3 (three) times daily as needed for muscle spasms.   lamoTRIgine 100 MG tablet Commonly known as: LAMICTAL Take 100 mg by mouth 2 (two) times daily.   multivitamin with minerals Tabs tablet Take 1 tablet by mouth daily. Notes to patient: Take 02/17/2019   promethazine 25 MG tablet Commonly known as: PHENERGAN Take 25 mg by mouth every 6 (six) hours as needed for nausea or vomiting.   sertraline 100 MG tablet Commonly known as: ZOLOFT Take 100 mg by mouth at bedtime.

## 2019-02-16 NOTE — Progress Notes (Signed)
STROKE TEAM PROGRESS NOTE   INTERVAL HISTORY Pt sitting in bed. Back to baseline. 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 No HA. Resolved in . MRI no stroke, stroke work up neg except LDL 130.   Vitals:   02/15/19 1948 02/15/19 2357 02/16/19 0345 02/16/19 0811  BP: 116/77 108/71 98/72 108/74  Pulse: 83 87 80 70  Resp: Temp: 98.4 F (36.9 C) 98.4 F (36.9 C) 98.4 F (36.9 C) 98.1 F (36.7 C)  TempSrc: Oral Oral Oral Oral  SpO2: 95% 95% 95% 94%  Weight:      Height:        CBC:  Recent Labs  Lab 02/15/19 1219 02/15/19 1225  WBC 10.4  --   NEUTROABS 6.7  --   HGB 15.2* 15.6*  HCT 44.1 46.0  MCV 99.8  --   PLT 220  --     Basic Metabolic Panel:  Recent Labs  Lab 02/15/19 1219 02/15/19 1225  NA 139 138  K 4.0 3.9  CL 101 99  CO2 27  --   GLUCOSE 86 84  BUN 11 13  CREATININE 0.94 0.90  CALCIUM 9.6  --    Lipid Panel:     Component Value Date/Time   CHOL 190 02/16/2019 0352   TRIG 107 02/16/2019 0352   HDL 39 (L) 02/16/2019 0352   CHOLHDL 4.9 02/16/2019 0352   VLDL 21 02/16/2019 0352   LDLCALC 130 (H) 02/16/2019 0352   HgbA1c:  Lab Results  Component Value Date   HGBA1C 5.3 02/16/2019   Urine Drug Screen: No results found for: LABOPIA, COCAINSCRNUR, LABBENZ, AMPHETMU, THCU, LABBARB  Alcohol Level No results found for: [facility]  IMAGING Mr Angio Head Wo Contrast  Result Date: 02/15/2019 CLINICAL DATA:  Sudden onset right leg and right arm weakness and numbness. EXAM: MRI HEAD WITHOUT CONTRAST MRA HEAD WITHOUT CONTRAST TECHNIQUE: Multiplanar, multiecho pulse sequences of the brain and surrounding structures were obtained without intravenous contrast. Angiographic images of the head were obtained using MRA technique without contrast. COMPARISON:  Head CT same day FINDINGS: MRI HEAD FINDINGS Brain: Diffusion imaging does not show any definite infarction. One could  question very minimal restricted diffusion in the coronal radiography on the left which could indicate a minor white matter insult. Elsewhere, the brainstem and cerebellum are normal. Cerebral hemispheres show mild chronic small-vessel ischemic change of the white matter in the frontal region, which is advanced for age. No cortical or large vessel territory infarction. No mass lesion, hemorrhage, hydrocephalus or extra-axial collection. Vascular: Major vessels at the base of the brain show flow. Skull and upper cervical spine: Negative Sinuses/Orbits: Clear/normal Other: None MRA HEAD FINDINGS Both internal carotid arteries are widely patent into the brain. No siphon stenosis. The anterior and middle cerebral vessels are patent without proximal stenosis, aneurysm or vascular malformation. Both vertebral arteries are widely patent to the basilar. No basilar stenosis. Posterior circulation branch vessels appear normal. IMPRESSION: 1. Mild chronic small-vessel change of the hemispheric white matter, more than should be seen at this age. Diffusion imaging raises the question of a tiny acute infarction in the corona radiata on the left. I do not think this is definite, but is suspicious. 2. MRA head: No large or medium vessel occlusion or correctable proximal stenosis. Electronically Signed   By: Paulina Fusi M.D.   On: 02/15/2019 14:15   Mr Brain  Wo Contrast  Result Date: 02/15/2019 CLINICAL DATA:  Rule out stroke. Right leg and arm weakness and numbness EXAM: MRI HEAD WITHOUT CONTRAST TECHNIQUE: Multiplanar, multiecho pulse sequences of the brain and surrounding structures were obtained without intravenous contrast. COMPARISON:  MRI head 02/15/2019 FINDINGS: Coronal and axial diffusion only performed to follow-up previous abnormality. Prior study question a subtle area of restricted diffusion in the left posterior corona radiata. This is less apparent on the current study and is not felt to be acute infarct based  on follow-up. No areas of acute infarction identified. IMPRESSION: No acute infarct or repeat diffusion imaging. Electronically Signed   By: Franchot Gallo M.D.   On: 02/15/2019 20:43   Mr Brain Wo Contrast  Result Date: 02/15/2019 CLINICAL DATA:  Sudden onset right leg and right arm weakness and numbness. EXAM: MRI HEAD WITHOUT CONTRAST MRA HEAD WITHOUT CONTRAST TECHNIQUE: Multiplanar, multiecho pulse sequences of the brain and surrounding structures were obtained without intravenous contrast. Angiographic images of the head were obtained using MRA technique without contrast. COMPARISON:  Head CT same day FINDINGS: MRI HEAD FINDINGS Brain: Diffusion imaging does not show any definite infarction. One could question very minimal restricted diffusion in the coronal radiography on the left which could indicate a minor white matter insult. Elsewhere, the brainstem and cerebellum are normal. Cerebral hemispheres show mild chronic small-vessel ischemic change of the white matter in the frontal region, which is advanced for age. No cortical or large vessel territory infarction. No mass lesion, hemorrhage, hydrocephalus or extra-axial collection. Vascular: Major vessels at the base of the brain show flow. Skull and upper cervical spine: Negative Sinuses/Orbits: Clear/normal Other: None MRA HEAD FINDINGS Both internal carotid arteries are widely patent into the brain. No siphon stenosis. The anterior and middle cerebral vessels are patent without proximal stenosis, aneurysm or vascular malformation. Both vertebral arteries are widely patent to the basilar. No basilar stenosis. Posterior circulation branch vessels appear normal. IMPRESSION: 1. Mild chronic small-vessel change of the hemispheric white matter, more than should be seen at this age. Diffusion imaging raises the question of a tiny acute infarction in the corona radiata on the left. I do not think this is definite, but is suspicious. 2. MRA head: No large or  medium vessel occlusion or correctable proximal stenosis. Electronically Signed   By: Nelson Chimes M.D.   On: 02/15/2019 14:15   Dg Chest Portable 1 View  Result Date: 02/15/2019 CLINICAL DATA:  Altered mental status and weakness EXAM: PORTABLE CHEST 1 VIEW COMPARISON:  03/21/2018 FINDINGS: The heart size and mediastinal contours are within normal limits. Both lungs are clear. The visualized skeletal structures are unremarkable. IMPRESSION: No active disease. Electronically Signed   By: Inez Catalina M.D.   On: 02/15/2019 13:18   Ct Head Code Stroke Wo Contrast  Result Date: 02/15/2019 CLINICAL DATA:  Code stroke.  Dizziness.  Right-sided weakness. EXAM: CT HEAD WITHOUT CONTRAST TECHNIQUE: Contiguous axial images were obtained from the base of the skull through the vertex without intravenous contrast. COMPARISON:  03/29/2018 FINDINGS: Brain: Normal appearance without evidence of old or acute infarction, mass lesion, hemorrhage, hydrocephalus or extra-axial collection. Vascular: No abnormal vascular finding. Skull: Normal Sinuses/Orbits: Clear/normal Other: None ASPECTS ([place] Stroke Program Early CT Score) - Ganglionic level infarction (caudate, lentiform nuclei, internal capsule, insula, M1-M3 cortex): 7 - Supraganglionic infarction (M4-M6 cortex): 3 Total score (0-10 with 10 being normal): 10 IMPRESSION: 1. Normal head CT 2. ASPECTS is 10 3. These results were communicated to Dr.  Lindzen at 12:35 pmon 10/22/2020by text page via the [facility] messaging system. Electronically Signed   By: Paulina FusiMark  Shogry M.D.   On: 02/15/2019 12:35    PHYSICAL EXAM  Temp:  [97.9 F (36.6 C)-98.6 F (37 C)] 97.9 F (36.6 C) (10/23 1143) Pulse Rate:  [70-87] 77 (10/23 1143) Resp:  [15-20] 20 (10/23 1143) BP: (94-129)/(64-90) 94/64 (10/23 1143) SpO2:  [94 %-100 %] 96 % (10/23 1143)  General - Well nourished, well developed, in no apparent distress.  Ophthalmologic - fundi not visualized due to  noncooperation.  Cardiovascular - Regular rhythm and rate.  Mental Status -  Level of arousal and orientation to time, place, and person were intact. Language including expression, naming, repetition, comprehension was assessed and found intact. Attention span and concentration were normal. Fund of Knowledge was assessed and was intact.  Cranial Nerves II - XII - II - Visual field intact OU. III, IV, VI - Extraocular movements intact. V - Facial sensation intact bilaterally. VII - Facial movement intact bilaterally. VIII - Hearing & vestibular intact bilaterally. X - Palate elevates symmetrically. XI - Chin turning & shoulder shrug intact bilaterally. XII - Tongue protrusion intact.  Motor Strength - The patient's strength was normal in all extremities and pronator drift was absent.  Bulk was normal and fasciculations were absent.   Motor Tone - Muscle tone was assessed at the neck and appendages and was normal.  Reflexes - The patient's reflexes were symmetrical in all extremities and she had no pathological reflexes.  Sensory - Light touch, temperature/pinprick were assessed and were symmetrical.    Coordination - The patient had normal movements in the hands and feet with no ataxia or dysmetria.  Tremor was absent.  Gait and Station - deferred.   ASSESSMENT/PLAN Ms. Heather Gomez is a 53 y.o. female with history of migraines, kidney stones, hypertension and depression presenting with R arm and leg heaviness.   L brain TIA likely but complicated migraine without HA still in DDx - risk factor including HTN, HLD, smoker and HIV  Code Stroke CT head No acute abnormality. ASPECTS 10.     MRI  EXCESSIVE for age chronic small vessel disease. suspicious for tiny infarct L corona radiata   MRA  Unremarkable  Repeat MRI no acute infarct   Carotid Doppler unremarkable  2D Echo EF 60-65%. No source of embolus   LDL 130  HgbA1c 5.3  HIV REACTIVE  Lovenox 40 mg sq  daily for VTE prophylaxis  No antithrombotic prior to admission, now on aspirin 81 mg daily and plavix 75 daily for 3 weeks and then ASA alone.  Therapy recommendations:  No therapy needs  Disposition:  Return home  HIV positive  HIV Abx testing postive  No established diagnosis before  Will need ID follow up   Management as per primary team  Tobacco abuse  Current heavy smoker  Smoking cessation counseling provided  Pt is willing to quit  Hypertension  Stable . BP goal normotensive  Hyperlipidemia  Home meds:  No statin  Now on lipitor 40  LDL 130, goal < 70  Continue statin at discharge  Other Stroke Risk Factors  Obesity, Body mass index is 32.28 kg/m., recommend weight loss, diet and exercise as appropriate   Migraines w/ recent increase in frequency - following with Dr. Neale BurlyFreeman   Other Active Problems  Depression/mood d/o  Hospital day # 0  Neurology will sign off. Please call with questions. Pt will follow up with  her neurologist Dr. Neale Burly in about 4 weeks. Thanks for the consult.  Marvel Plan, MD PhD Stroke Neurology 02/16/2019 12:54 PM  To contact Stroke Continuity provider, please refer to [email or web address]. After hours, contact General Neurology

## 2019-02-17 LAB — HIV-1 RNA QUANT-NO REFLEX-BLD
HIV 1 RNA Quant: <20 copies/mL
LOG10 HIV-1 RNA: UNDETERMINED log10copy/mL

## 2019-02-19 LAB — PANEL 083904
HIV 1 AB: NEGATIVE
HIV 2 AB: NEGATIVE
Note: NEGATIVE

## 2019-02-23 DIAGNOSIS — Z1211 Encounter for screening for malignant neoplasm of colon: Secondary | ICD-10-CM | POA: Diagnosis not present

## 2019-02-23 DIAGNOSIS — G459 Transient cerebral ischemic attack, unspecified: Secondary | ICD-10-CM | POA: Diagnosis not present

## 2019-02-23 DIAGNOSIS — Z23 Encounter for immunization: Secondary | ICD-10-CM | POA: Diagnosis not present

## 2019-02-25 DIAGNOSIS — J209 Acute bronchitis, unspecified: Secondary | ICD-10-CM | POA: Diagnosis not present

## 2019-03-05 DIAGNOSIS — J019 Acute sinusitis, unspecified: Secondary | ICD-10-CM | POA: Diagnosis not present

## 2019-03-13 MED FILL — CYCLOBENZAPRINE HCL 10 MG T: 10 | 30 days supply | Qty: 30 | Fill #2

## 2019-03-13 MED FILL — LAMOTRIGINE 100 MG TABS: 100 | 30 days supply | Qty: 120 | Fill #2

## 2019-03-13 MED FILL — SERTRALINE HCL 100 MG TABS: 100 | 90 days supply | Qty: 90 | Fill #0

## 2019-03-19 MED FILL — CARVEDILOL 6.25 MG TABLET: 6.25 | 30 days supply | Qty: 60 | Fill #0

## 2019-03-19 MED FILL — ATORVASTATIN 40 MG TABLET: 40 | 30 days supply | Qty: 30 | Fill #0

## 2019-03-30 DIAGNOSIS — I10 Essential (primary) hypertension: Secondary | ICD-10-CM | POA: Diagnosis not present

## 2019-03-30 DIAGNOSIS — I679 Cerebrovascular disease, unspecified: Secondary | ICD-10-CM | POA: Diagnosis not present

## 2019-03-30 MED FILL — CARVEDILOL 12.5 MG TABLET: 12.5 | 30 days supply | Qty: 60 | Fill #0

## 2019-04-16 MED FILL — CYCLOBENZAPRINE HCL 10 MG T: 10 | 30 days supply | Qty: 30 | Fill #3

## 2019-04-16 MED FILL — CARVEDILOL 12.5 MG TABLET: 12.5 | 30 days supply | Qty: 60 | Fill #0

## 2019-04-16 MED FILL — LAMOTRIGINE 100 MG TABS: 100 | 30 days supply | Qty: 120 | Fill #3

## 2019-05-22 MED FILL — CARVEDILOL 12.5 MG TABLET: 12.5 | 30 days supply | Qty: 60 | Fill #1

## 2019-05-22 MED FILL — PROMETHAZINE 25 MG TABLET: 25 | 7 days supply | Qty: 30 | Fill #1

## 2019-05-22 MED FILL — CYCLOBENZAPRINE HCL 10 MG T: 10 | 30 days supply | Qty: 30 | Fill #4

## 2019-05-22 MED FILL — LAMOTRIGINE 100 MG TABS: 100 | 30 days supply | Qty: 120 | Fill #4

## 2019-06-29 MED FILL — CARVEDILOL 12.5 MG TABLET: 12.5 | 30 days supply | Qty: 60 | Fill #2

## 2019-06-29 MED FILL — PROMETHAZINE 25 MG TABLET: 25 | 7 days supply | Qty: 30 | Fill #2

## 2019-06-29 MED FILL — CYCLOBENZAPRINE HCL 10 MG T: 10 | 30 days supply | Qty: 30 | Fill #5

## 2019-06-29 MED FILL — LAMOTRIGINE 100 MG TABS: 100 | 30 days supply | Qty: 120 | Fill #5

## 2019-07-02 MED FILL — SERTRALINE HCL 100 MG TABS: 100 | 90 days supply | Qty: 90 | Fill #0

## 2019-07-17 MED FILL — SERTRALINE HCL 100 MG TABS: 100 | 90 days supply | Qty: 90 | Fill #0

## 2019-08-09 MED FILL — AMLODIPINE BESYLATE 5 MG TA: 5 | 30 days supply | Qty: 30 | Fill #0

## 2019-08-13 MED FILL — LISINOPRIL-HCTZ 10-12.5 MG: 10-12.5 | 30 days supply | Qty: 30 | Fill #0

## 2019-08-13 MED FILL — CARVEDILOL 12.5 MG TABLET: 12.5 | 30 days supply | Qty: 60 | Fill #3

## 2019-08-21 ENCOUNTER — Other Ambulatory Visit (HOSPITAL_BASED_OUTPATIENT_CLINIC_OR_DEPARTMENT_OTHER): Payer: Self-pay | Admitting: Specialist

## 2019-08-21 MED FILL — LAMOTRIGINE 100 MG TABS: 100 | 30 days supply | Qty: 120 | Fill #0

## 2019-09-10 MED FILL — LISINOPRIL-HCTZ 10-12.5 MG: 10-12.5 | 30 days supply | Qty: 30 | Fill #1

## 2019-09-10 MED FILL — AMLODIPINE BESYLATE 5 MG TA: 5 | 30 days supply | Qty: 30 | Fill #1

## 2019-09-14 MED FILL — CARVEDILOL 12.5 MG TABLET: 12.5 | 30 days supply | Qty: 60 | Fill #0

## 2019-09-25 MED FILL — LAMOTRIGINE 100 MG TABS: 100 | 30 days supply | Qty: 120 | Fill #1

## 2019-10-17 MED FILL — CYCLOBENZAPRINE HCL 10 MG T: 10 | 30 days supply | Qty: 30 | Fill #0

## 2019-10-17 MED FILL — CARVEDILOL 12.5 MG TABLET: 12.5 | 30 days supply | Qty: 60 | Fill #1

## 2019-10-17 MED FILL — AMLODIPINE BESYLATE 5 MG TA: 5 | 30 days supply | Qty: 30 | Fill #0

## 2019-10-17 MED FILL — LISINOPRIL-HCTZ 10-12.5 MG: 10-12.5 | 30 days supply | Qty: 30 | Fill #0

## 2019-10-30 MED FILL — ATORVASTATIN CALCIUM 40 MG: 40 | 30 days supply | Qty: 30 | Fill #0

## 2019-10-30 MED FILL — LAMOTRIGINE 100 MG TABS: 100 | 30 days supply | Qty: 120 | Fill #2

## 2019-10-31 MED FILL — SERTRALINE HCL 100 MG TABS: 100 | 90 days supply | Qty: 90 | Fill #0

## 2019-11-14 MED FILL — CARVEDILOL 12.5 MG TABLET: 12.5 | 30 days supply | Qty: 60 | Fill #2

## 2019-11-15 MED FILL — LISINOPRIL-HCTZ 10-12.5 MG: 10-12.5 | 30 days supply | Qty: 30 | Fill #0

## 2019-11-26 ENCOUNTER — Other Ambulatory Visit (HOSPITAL_BASED_OUTPATIENT_CLINIC_OR_DEPARTMENT_OTHER): Payer: Self-pay

## 2019-11-26 DIAGNOSIS — R0683 Snoring: Secondary | ICD-10-CM

## 2019-11-26 DIAGNOSIS — G4710 Hypersomnia, unspecified: Secondary | ICD-10-CM

## 2019-11-26 DIAGNOSIS — R0681 Apnea, not elsewhere classified: Secondary | ICD-10-CM

## 2019-12-03 MED FILL — LAMOTRIGINE 100 MG TABS: 100 | 30 days supply | Qty: 120 | Fill #3

## 2019-12-03 MED FILL — ATORVASTATIN CALCIUM 40 MG: 40 | 30 days supply | Qty: 30 | Fill #1

## 2019-12-18 MED FILL — LISINOPRIL-HCTZ 10-12.5 MG: 10-12.5 | 30 days supply | Qty: 30 | Fill #1

## 2019-12-18 MED FILL — CARVEDILOL 12.5 MG TABLET: 12.5 | 30 days supply | Qty: 60 | Fill #2

## 2019-12-19 MED FILL — CYCLOBENZAPRINE HCL 10 MG T: 10 | 30 days supply | Qty: 30 | Fill #0

## 2019-12-20 MED FILL — PROMETHAZINE 25 MG TABLET: 25 | 7 days supply | Qty: 30 | Fill #0

## 2019-12-28 ENCOUNTER — Other Ambulatory Visit: Payer: Self-pay

## 2019-12-28 ENCOUNTER — Ambulatory Visit (HOSPITAL_BASED_OUTPATIENT_CLINIC_OR_DEPARTMENT_OTHER): Payer: Medicaid Other | Attending: Internal Medicine | Admitting: Internal Medicine

## 2019-12-28 VITALS — Ht 65.0 in | Wt 196.0 lb

## 2019-12-28 DIAGNOSIS — G4733 Obstructive sleep apnea (adult) (pediatric): Secondary | ICD-10-CM | POA: Insufficient documentation

## 2019-12-28 DIAGNOSIS — G4710 Hypersomnia, unspecified: Secondary | ICD-10-CM

## 2019-12-31 NOTE — Procedures (Signed)
   NAME: Heather Gomez DATE OF BIRTH:  1966-03-21 MEDICAL RECORD NUMBER [id number]  LOCATION: [facility]  PHYSICIAN: Deretha Emory  DATE OF STUDY: 12/28/2019  SLEEP STUDY TYPE: Nocturnal Polysomnogram               REFERRING PHYSICIAN: Deretha Emory, MD  INDICATION FOR STUDY: excessive daytime sleepiness, h/o severe OSA in the past.   EPWORTH SLEEPINESS SCORE:  22 HEIGHT: 5\' 5"  (165.1 cm)  WEIGHT: 196 lb (88.9 kg)    Body mass index is 32.62 kg/m.  NECK SIZE: 16 in.  MEDICATIONS Patient self administered medications include: N/A. Medications administered during study include No sleep medicine administered.  SLEEP STUDY TECHNIQUE A multi-channel overnight Polysomnography study was performed. The channels recorded and monitored were central and occipital EEG, electrooculogram (EOG), submentalis EMG (chin), nasal and oral airflow, thoracic and abdominal wall motion, anterior tibialis EMG, snore microphone, electrocardiogram, and a pulse oximetry.  TECHNICAL COMMENTS Comments added by Technician: Patient had difficulty initiating sleep. PATIENT SNORES VERY LOUD Comments added by Scorer: N/A  SLEEP ARCHITECTURE The study was initiated at 10:46:41 PM and terminated at 5:18:40 AM. The total recorded time was 392 minutes. EEG confirmed total sleep time was 359.8 minutes yielding a sleep efficiency of 91.8%%. Sleep onset after lights out was 10.7 minutes with a REM latency of 129.0 minutes. The patient spent 4.2%% of the night in stage N1 sleep, 67.1%% in stage N2 sleep, 0.0%% in stage N3 and 28.8% in REM. Wake after sleep onset (WASO) was 21.5 minutes. The Arousal Index was 5.5/hour.  RESPIRATORY PARAMETERS There were a total of 47 respiratory disturbances out of which 11 were apneas ( 11 obstructive, 0 mixed, 0 central) and 36 hypopneas. The apnea/hypopnea index (AHI) was 7.8 events/hour. The central sleep apnea index was 0.0 events/hour. The REM AHI was  15.7 events/hour and NREM AHI was 4.7 events/hour. The supine AHI was 4.1 events/hour and the non supine AHI was 17.9 events/hour. Respiratory disturbance index was 8.3 events/hour overall and 17.4 events/hour supine. Respiratory disturbances were associated with oxygen desaturation down to a nadir of 85.0% during sleep. The mean oxygen saturation during the study was 91.5%. The cumulative time under 88% oxygen saturation was 4.2 minutes.  LEG MOVEMENT DATA The total leg movements were 6 with a resulting leg movement index of 1.0/hr . Associated arousal with leg movement index was 0.0/hr.  CARDIAC DATA The underlying cardiac rhythm was most consistent with sinus rhythm. Mean heart rate during sleep was 69.6 bpm. Additional rhythm abnormalities include None.  IMPRESSIONS - Mild Obstructive Sleep apnea(OSA) - Mild oxygen desaturations - No Significant Central Sleep Apnea (CSA) - No significant periodic leg movements(PLMs) during sleep.   DIAGNOSIS - Obstructive Sleep Apnea (G47.33)  RECOMMENDATIONS - If treatment is deemed necessary, the patient could be treated with weight loss (if appropriate), OAT, or CPAP.   Marland Kitchen Sleep specialist, American Board of Internal Medicine  ELECTRONICALLY SIGNED ON:  12/31/2019, 9:58 AM [facility] PH: [phone]   FX: [phone] ACCREDITED BY THE AMERICAN ACADEMY OF SLEEP MEDICINE

## 2020-01-01 ENCOUNTER — Other Ambulatory Visit: Payer: Self-pay

## 2020-01-01 ENCOUNTER — Other Ambulatory Visit (HOSPITAL_BASED_OUTPATIENT_CLINIC_OR_DEPARTMENT_OTHER): Payer: Self-pay

## 2020-01-01 DIAGNOSIS — G4710 Hypersomnia, unspecified: Secondary | ICD-10-CM

## 2020-01-01 DIAGNOSIS — R0681 Apnea, not elsewhere classified: Secondary | ICD-10-CM

## 2020-01-01 DIAGNOSIS — R0683 Snoring: Secondary | ICD-10-CM

## 2020-01-08 ENCOUNTER — Other Ambulatory Visit (HOSPITAL_BASED_OUTPATIENT_CLINIC_OR_DEPARTMENT_OTHER): Payer: Self-pay | Admitting: Family Medicine

## 2020-01-08 MED FILL — ATORVASTATIN CALCIUM 40 MG: 40 | 30 days supply | Qty: 30 | Fill #2

## 2020-01-08 MED FILL — LAMOTRIGINE 100 MG TABS: 100 | 30 days supply | Qty: 120 | Fill #4

## 2020-01-08 MED FILL — CYCLOBENZAPRINE HCL 10 MG T: 10 | 30 days supply | Qty: 30 | Fill #0

## 2020-01-11 MED FILL — CARVEDILOL 12.5 MG TABLET: 12.5 | 30 days supply | Qty: 60 | Fill #3

## 2020-01-21 MED FILL — PROMETHAZINE 25 MG TABLET: 25 | 7 days supply | Qty: 30 | Fill #0

## 2020-01-21 MED FILL — LISINOPRIL-HCTZ 10-12.5 MG: 10-12.5 | 30 days supply | Qty: 30 | Fill #0

## 2020-02-11 ENCOUNTER — Other Ambulatory Visit (HOSPITAL_BASED_OUTPATIENT_CLINIC_OR_DEPARTMENT_OTHER): Payer: Self-pay | Admitting: Family Medicine

## 2020-02-11 MED FILL — LAMOTRIGINE 100 MG TABS: 100 | 30 days supply | Qty: 120 | Fill #5

## 2020-02-12 ENCOUNTER — Other Ambulatory Visit (HOSPITAL_BASED_OUTPATIENT_CLINIC_OR_DEPARTMENT_OTHER): Payer: Self-pay | Admitting: Physician Assistant

## 2020-02-12 MED FILL — SERTRALINE HCL 100 MG TABS: 100 | 90 days supply | Qty: 90 | Fill #0

## 2020-02-12 MED FILL — CYCLOBENZAPRINE HCL 10 MG T: 10 | 30 days supply | Qty: 30 | Fill #0

## 2020-02-12 MED FILL — CARVEDILOL 12.5 MG TABLET: 12.5 | 30 days supply | Qty: 60 | Fill #0

## 2020-02-13 MED FILL — HYDROCODON-APAP 5-325: 5-325 | 3 days supply | Qty: 15 | Fill #0

## 2020-02-13 MED FILL — ONDANSETRON ODT 4 MG TABLET: 4 | 21 days supply | Qty: 18 | Fill #0

## 2020-02-24 ENCOUNTER — Other Ambulatory Visit (HOSPITAL_BASED_OUTPATIENT_CLINIC_OR_DEPARTMENT_OTHER): Payer: Self-pay

## 2020-02-25 MED FILL — METHYLPREDNISOLONE 4 MG TBP: 4 | 6 days supply | Qty: 21 | Fill #0

## 2020-02-25 MED FILL — LISINOPRIL-HCTZ 10-12.5 MG: 10-12.5 | 30 days supply | Qty: 30 | Fill #1

## 2020-02-25 MED FILL — ATORVASTATIN CALCIUM 40 MG: 40 | 30 days supply | Qty: 30 | Fill #1

## 2020-03-07 MED FILL — CARVEDILOL 12.5 MG TABLET: 12.5 | 30 days supply | Qty: 60 | Fill #0

## 2020-03-24 ENCOUNTER — Other Ambulatory Visit (HOSPITAL_BASED_OUTPATIENT_CLINIC_OR_DEPARTMENT_OTHER): Payer: Self-pay | Admitting: Specialist

## 2020-03-24 ENCOUNTER — Other Ambulatory Visit: Payer: Medicaid Other

## 2020-03-24 DIAGNOSIS — Z20822 Contact with and (suspected) exposure to covid-19: Secondary | ICD-10-CM

## 2020-03-24 MED FILL — LAMOTRIGINE 100 MG TABS: 100 | 30 days supply | Qty: 120 | Fill #0

## 2020-03-25 LAB — SARS-COV-2, NAA 2 DAY TAT

## 2020-03-25 LAB — NOVEL CORONAVIRUS, NAA: SARS-CoV-2, NAA: DETECTED — AB

## 2020-03-26 ENCOUNTER — Telehealth (HOSPITAL_COMMUNITY): Payer: Self-pay

## 2020-03-26 NOTE — Telephone Encounter (Signed)
Called to Discuss with patient about Covid symptoms and the use of the monoclonal antibody infusion for those with mild to moderate Covid symptoms and at a high risk of hospitalization.     Pt appears to qualify for this infusion due to co-morbid conditions and/or a member of an at-risk group in accordance with the FDA Emergency Use Authorization.    Unable to reach pt   Kavaughn Faucett R Alazar Cherian, RN   

## 2020-04-07 ENCOUNTER — Other Ambulatory Visit (HOSPITAL_BASED_OUTPATIENT_CLINIC_OR_DEPARTMENT_OTHER): Payer: Self-pay | Admitting: Family Medicine

## 2020-04-07 MED FILL — ATORVASTATIN CALCIUM 40 MG: 40 | 30 days supply | Qty: 30 | Fill #0

## 2020-04-07 MED FILL — CARVEDILOL 12.5 MG TABLET: 12.5 | 30 days supply | Qty: 60 | Fill #0

## 2020-04-07 MED FILL — LISINOPRIL-HCTZ 10-12.5 MG: 10-12.5 | 30 days supply | Qty: 30 | Fill #0

## 2020-04-07 MED FILL — CYCLOBENZAPRINE HCL 10 MG T: 10 | 30 days supply | Qty: 30 | Fill #0

## 2020-05-06 ENCOUNTER — Other Ambulatory Visit (HOSPITAL_BASED_OUTPATIENT_CLINIC_OR_DEPARTMENT_OTHER): Payer: Self-pay | Admitting: Specialist

## 2020-05-06 MED FILL — LAMOTRIGINE 100 MG TABS: 100 | 30 days supply | Qty: 120 | Fill #0

## 2020-05-06 MED FILL — LISINOPRIL-HCTZ 10-12.5 MG: 10-12.5 | 30 days supply | Qty: 30 | Fill #1

## 2020-05-09 ENCOUNTER — Other Ambulatory Visit (HOSPITAL_COMMUNITY): Payer: Self-pay | Admitting: Family Medicine

## 2020-05-09 MED FILL — ATORVASTATIN 40 MG TABLET: 40 | 30 days supply | Qty: 30 | Fill #0

## 2020-05-09 MED FILL — CARVEDILOL 12.5 MG TABLET: 12.5 | 30 days supply | Qty: 60 | Fill #1

## 2020-05-13 MED FILL — ONDANSETRON ODT 4 MG TABLET: 4 | 2 days supply | Qty: 10 | Fill #1

## 2020-06-19 ENCOUNTER — Other Ambulatory Visit (HOSPITAL_COMMUNITY): Payer: Self-pay | Admitting: Specialist

## 2020-06-19 DIAGNOSIS — G43019 Migraine without aura, intractable, without status migrainosus: Secondary | ICD-10-CM | POA: Diagnosis not present

## 2020-06-19 DIAGNOSIS — R519 Headache, unspecified: Secondary | ICD-10-CM | POA: Diagnosis not present

## 2020-06-19 DIAGNOSIS — G43719 Chronic migraine without aura, intractable, without status migrainosus: Secondary | ICD-10-CM | POA: Diagnosis not present

## 2020-06-19 MED FILL — LAMOTRIGINE 100 MG TABS: 100 | 30 days supply | Qty: 120 | Fill #0

## 2020-06-23 ENCOUNTER — Other Ambulatory Visit (HOSPITAL_BASED_OUTPATIENT_CLINIC_OR_DEPARTMENT_OTHER): Payer: Self-pay | Admitting: Family Medicine

## 2020-06-23 MED FILL — ATORVASTATIN CALCIUM 40 MG: 40 | 30 days supply | Qty: 30 | Fill #0

## 2020-06-23 MED FILL — LISINOPRIL-HCTZ 10-12.5 MG: 10-12.5 | 30 days supply | Qty: 30 | Fill #0

## 2020-06-27 ENCOUNTER — Other Ambulatory Visit (HOSPITAL_BASED_OUTPATIENT_CLINIC_OR_DEPARTMENT_OTHER): Payer: Self-pay | Admitting: Family Medicine

## 2020-06-27 DIAGNOSIS — E782 Mixed hyperlipidemia: Secondary | ICD-10-CM | POA: Diagnosis not present

## 2020-06-27 DIAGNOSIS — G43909 Migraine, unspecified, not intractable, without status migrainosus: Secondary | ICD-10-CM | POA: Diagnosis not present

## 2020-06-27 DIAGNOSIS — E669 Obesity, unspecified: Secondary | ICD-10-CM | POA: Diagnosis not present

## 2020-06-27 DIAGNOSIS — I10 Essential (primary) hypertension: Secondary | ICD-10-CM | POA: Diagnosis not present

## 2020-06-27 MED FILL — CYCLOBENZAPRINE HCL 10 MG T: 10 | 90 days supply | Qty: 270 | Fill #0

## 2020-07-14 ENCOUNTER — Other Ambulatory Visit (HOSPITAL_BASED_OUTPATIENT_CLINIC_OR_DEPARTMENT_OTHER): Payer: Self-pay | Admitting: Family Medicine

## 2020-07-14 MED FILL — CARVEDILOL 12.5 MG TABLET: 12.5 | 30 days supply | Qty: 60 | Fill #2

## 2020-07-15 MED FILL — SERTRALINE HCL 100 MG TABS: 100 | 90 days supply | Qty: 90 | Fill #0

## 2020-07-16 DIAGNOSIS — Z01419 Encounter for gynecological examination (general) (routine) without abnormal findings: Secondary | ICD-10-CM | POA: Diagnosis not present

## 2020-07-16 DIAGNOSIS — Z1231 Encounter for screening mammogram for malignant neoplasm of breast: Secondary | ICD-10-CM | POA: Diagnosis not present

## 2020-07-16 DIAGNOSIS — Z124 Encounter for screening for malignant neoplasm of cervix: Secondary | ICD-10-CM | POA: Diagnosis not present

## 2020-07-16 MED FILL — ATORVASTATIN CALCIUM 40 MG: 40 | 30 days supply | Qty: 30 | Fill #1

## 2020-08-05 ENCOUNTER — Other Ambulatory Visit (HOSPITAL_BASED_OUTPATIENT_CLINIC_OR_DEPARTMENT_OTHER): Payer: Self-pay

## 2020-08-05 MED ORDER — SERTRALINE HCL 100 MG PO TABS
ORAL_TABLET | ORAL | 1 refills | Status: DC
  Filled 2020-08-05 – 2021-02-25 (×2): qty 90, 90d supply, fill #0
  Filled 2021-06-12: qty 90, 90d supply, fill #1

## 2020-08-05 MED ORDER — ATORVASTATIN CALCIUM 40 MG PO TABS
ORAL_TABLET | ORAL | 1 refills | Status: DC
  Filled 2020-08-05 – 2020-09-26 (×2): qty 90, 90d supply, fill #0
  Filled 2021-01-12: qty 90, 90d supply, fill #1

## 2020-08-05 MED ORDER — PROMETHAZINE HCL 25 MG PO TABS
ORAL_TABLET | ORAL | 0 refills | Status: DC
  Filled 2020-08-05: qty 30, 7d supply, fill #0

## 2020-08-05 MED ORDER — CARVEDILOL 12.5 MG PO TABS
ORAL_TABLET | ORAL | 1 refills | Status: DC
  Filled 2020-08-05 – 2020-08-29 (×2): qty 180, 90d supply, fill #0
  Filled 2021-01-12: qty 180, 90d supply, fill #1

## 2020-08-05 MED ORDER — LISINOPRIL-HYDROCHLOROTHIAZIDE 10-12.5 MG PO TABS
ORAL_TABLET | ORAL | 1 refills | Status: DC
  Filled 2020-08-05: qty 90, 90d supply, fill #0
  Filled 2020-11-14: qty 90, 90d supply, fill #1

## 2020-08-07 ENCOUNTER — Other Ambulatory Visit (HOSPITAL_BASED_OUTPATIENT_CLINIC_OR_DEPARTMENT_OTHER): Payer: Self-pay

## 2020-08-07 MED FILL — Lamotrigine Tab 100 MG: ORAL | 30 days supply | Qty: 120 | Fill #0 | Status: AC

## 2020-08-20 ENCOUNTER — Other Ambulatory Visit (HOSPITAL_BASED_OUTPATIENT_CLINIC_OR_DEPARTMENT_OTHER): Payer: Self-pay

## 2020-08-22 DIAGNOSIS — C44329 Squamous cell carcinoma of skin of other parts of face: Secondary | ICD-10-CM | POA: Diagnosis not present

## 2020-08-22 DIAGNOSIS — D485 Neoplasm of uncertain behavior of skin: Secondary | ICD-10-CM | POA: Diagnosis not present

## 2020-08-29 ENCOUNTER — Other Ambulatory Visit (HOSPITAL_BASED_OUTPATIENT_CLINIC_OR_DEPARTMENT_OTHER): Payer: Self-pay

## 2020-09-10 ENCOUNTER — Other Ambulatory Visit (HOSPITAL_BASED_OUTPATIENT_CLINIC_OR_DEPARTMENT_OTHER): Payer: Self-pay

## 2020-09-10 MED ORDER — HYDROCODONE-ACETAMINOPHEN 5-325 MG PO TABS
ORAL_TABLET | ORAL | 0 refills | Status: DC
  Filled 2020-09-10: qty 12, 2d supply, fill #0

## 2020-09-17 ENCOUNTER — Other Ambulatory Visit (HOSPITAL_BASED_OUTPATIENT_CLINIC_OR_DEPARTMENT_OTHER): Payer: Self-pay

## 2020-09-26 ENCOUNTER — Other Ambulatory Visit (HOSPITAL_BASED_OUTPATIENT_CLINIC_OR_DEPARTMENT_OTHER): Payer: Self-pay

## 2020-09-26 DIAGNOSIS — K5730 Diverticulosis of large intestine without perforation or abscess without bleeding: Secondary | ICD-10-CM | POA: Diagnosis not present

## 2020-09-26 DIAGNOSIS — Z7982 Long term (current) use of aspirin: Secondary | ICD-10-CM | POA: Diagnosis not present

## 2020-09-26 DIAGNOSIS — N29 Other disorders of kidney and ureter in diseases classified elsewhere: Secondary | ICD-10-CM | POA: Diagnosis not present

## 2020-09-26 DIAGNOSIS — N3289 Other specified disorders of bladder: Secondary | ICD-10-CM | POA: Diagnosis not present

## 2020-09-26 DIAGNOSIS — Z79899 Other long term (current) drug therapy: Secondary | ICD-10-CM | POA: Diagnosis not present

## 2020-09-26 DIAGNOSIS — F17210 Nicotine dependence, cigarettes, uncomplicated: Secondary | ICD-10-CM | POA: Diagnosis not present

## 2020-09-26 DIAGNOSIS — Z888 Allergy status to other drugs, medicaments and biological substances status: Secondary | ICD-10-CM | POA: Diagnosis not present

## 2020-09-26 DIAGNOSIS — R059 Cough, unspecified: Secondary | ICD-10-CM | POA: Diagnosis not present

## 2020-09-26 DIAGNOSIS — J9811 Atelectasis: Secondary | ICD-10-CM | POA: Diagnosis not present

## 2020-09-26 DIAGNOSIS — N3001 Acute cystitis with hematuria: Secondary | ICD-10-CM | POA: Diagnosis not present

## 2020-09-26 DIAGNOSIS — M549 Dorsalgia, unspecified: Secondary | ICD-10-CM | POA: Diagnosis not present

## 2020-09-26 DIAGNOSIS — Z20822 Contact with and (suspected) exposure to covid-19: Secondary | ICD-10-CM | POA: Diagnosis not present

## 2020-09-26 DIAGNOSIS — R0789 Other chest pain: Secondary | ICD-10-CM | POA: Diagnosis not present

## 2020-09-26 DIAGNOSIS — I10 Essential (primary) hypertension: Secondary | ICD-10-CM | POA: Diagnosis not present

## 2020-09-26 DIAGNOSIS — K5790 Diverticulosis of intestine, part unspecified, without perforation or abscess without bleeding: Secondary | ICD-10-CM | POA: Diagnosis not present

## 2020-09-26 DIAGNOSIS — M546 Pain in thoracic spine: Secondary | ICD-10-CM | POA: Diagnosis not present

## 2020-09-26 DIAGNOSIS — R109 Unspecified abdominal pain: Secondary | ICD-10-CM | POA: Diagnosis not present

## 2020-09-26 DIAGNOSIS — N281 Cyst of kidney, acquired: Secondary | ICD-10-CM | POA: Diagnosis not present

## 2020-09-26 DIAGNOSIS — Z886 Allergy status to analgesic agent status: Secondary | ICD-10-CM | POA: Diagnosis not present

## 2020-09-26 MED FILL — Lamotrigine Tab 100 MG: ORAL | 30 days supply | Qty: 120 | Fill #1 | Status: AC

## 2020-09-27 DIAGNOSIS — J9811 Atelectasis: Secondary | ICD-10-CM | POA: Diagnosis not present

## 2020-09-27 DIAGNOSIS — M546 Pain in thoracic spine: Secondary | ICD-10-CM | POA: Diagnosis not present

## 2020-09-27 DIAGNOSIS — N3289 Other specified disorders of bladder: Secondary | ICD-10-CM | POA: Diagnosis not present

## 2020-09-27 DIAGNOSIS — N29 Other disorders of kidney and ureter in diseases classified elsewhere: Secondary | ICD-10-CM | POA: Diagnosis not present

## 2020-09-27 DIAGNOSIS — R059 Cough, unspecified: Secondary | ICD-10-CM | POA: Diagnosis not present

## 2020-09-27 DIAGNOSIS — N281 Cyst of kidney, acquired: Secondary | ICD-10-CM | POA: Diagnosis not present

## 2020-09-27 DIAGNOSIS — K5730 Diverticulosis of large intestine without perforation or abscess without bleeding: Secondary | ICD-10-CM | POA: Diagnosis not present

## 2020-10-01 ENCOUNTER — Other Ambulatory Visit (HOSPITAL_BASED_OUTPATIENT_CLINIC_OR_DEPARTMENT_OTHER): Payer: Self-pay

## 2020-10-01 DIAGNOSIS — R079 Chest pain, unspecified: Secondary | ICD-10-CM | POA: Diagnosis not present

## 2020-10-01 MED ORDER — GABAPENTIN 300 MG PO CAPS
ORAL_CAPSULE | ORAL | 0 refills | Status: DC
  Filled 2020-10-01: qty 60, 30d supply, fill #0

## 2020-10-09 ENCOUNTER — Other Ambulatory Visit (HOSPITAL_BASED_OUTPATIENT_CLINIC_OR_DEPARTMENT_OTHER): Payer: Self-pay

## 2020-10-09 DIAGNOSIS — I10 Essential (primary) hypertension: Secondary | ICD-10-CM | POA: Diagnosis not present

## 2020-10-09 DIAGNOSIS — Z20822 Contact with and (suspected) exposure to covid-19: Secondary | ICD-10-CM | POA: Diagnosis not present

## 2020-10-09 DIAGNOSIS — T2113XA Burn of first degree of upper back, initial encounter: Secondary | ICD-10-CM | POA: Diagnosis not present

## 2020-10-09 DIAGNOSIS — M549 Dorsalgia, unspecified: Secondary | ICD-10-CM | POA: Diagnosis not present

## 2020-10-09 DIAGNOSIS — X19XXXA Contact with other heat and hot substances, initial encounter: Secondary | ICD-10-CM | POA: Diagnosis not present

## 2020-10-09 DIAGNOSIS — Z79899 Other long term (current) drug therapy: Secondary | ICD-10-CM | POA: Diagnosis not present

## 2020-10-09 DIAGNOSIS — F32A Depression, unspecified: Secondary | ICD-10-CM | POA: Diagnosis not present

## 2020-10-09 MED ORDER — OXYCODONE-ACETAMINOPHEN 5-325 MG PO TABS
ORAL_TABLET | ORAL | 0 refills | Status: DC
  Filled 2020-10-09: qty 14, 7d supply, fill #0

## 2020-10-10 ENCOUNTER — Other Ambulatory Visit (HOSPITAL_BASED_OUTPATIENT_CLINIC_OR_DEPARTMENT_OTHER): Payer: Self-pay

## 2020-10-10 MED ORDER — MELOXICAM 15 MG PO TABS
ORAL_TABLET | ORAL | 0 refills | Status: DC
  Filled 2020-10-10: qty 30, 30d supply, fill #0

## 2020-10-10 MED ORDER — SILVER SULFADIAZINE 1 % EX CREA
TOPICAL_CREAM | CUTANEOUS | 0 refills | Status: DC
  Filled 2020-10-10: qty 60, 7d supply, fill #0
  Filled 2020-10-10: qty 400, 30d supply, fill #0

## 2020-10-14 DIAGNOSIS — T2103XD Burn of unspecified degree of upper back, subsequent encounter: Secondary | ICD-10-CM | POA: Diagnosis not present

## 2020-10-25 IMAGING — CT CT RENAL STONE PROTOCOL
2 of 4 series · 16 of 46 positions shown, 18 images · non-contrast
Comparison: CT abdomen dated 10/03/2011.

CLINICAL DATA: LEFT flank pain with urinary urgency/frequency.

EXAM:
CT ABDOMEN AND PELVIS WITHOUT CONTRAST
TECHNIQUE: Multidetector CT imaging of the abdomen and pelvis was performed
following the standard protocol without IV contrast.

[Series 2: axial st · axial · 0.92mm/px · z∈[-431,+34]mm · 13 of 103 slices shown, 15 images]
[im 5/103  soft-tissue]
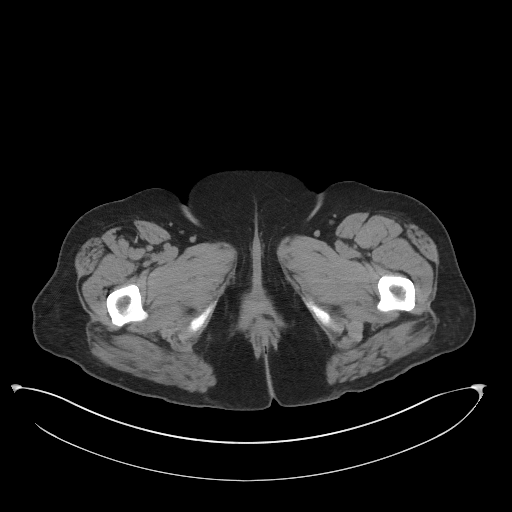
[im 5/103  bone]
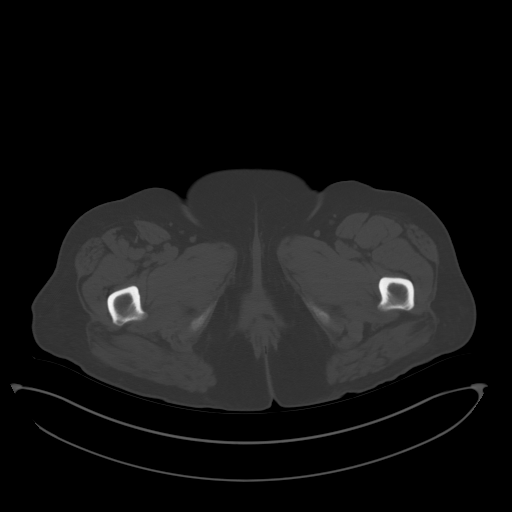
[im 13/103  soft-tissue]
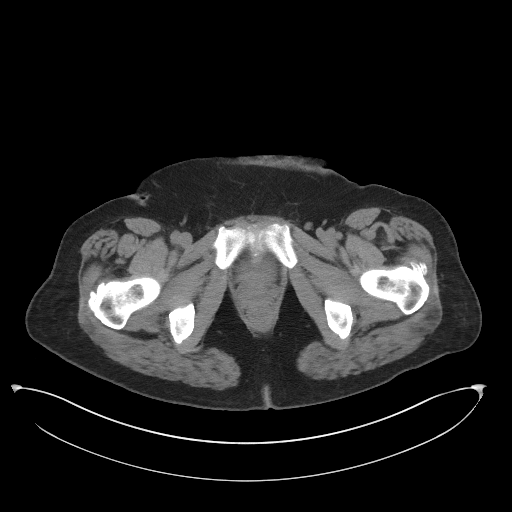
[im 22/103  soft-tissue]
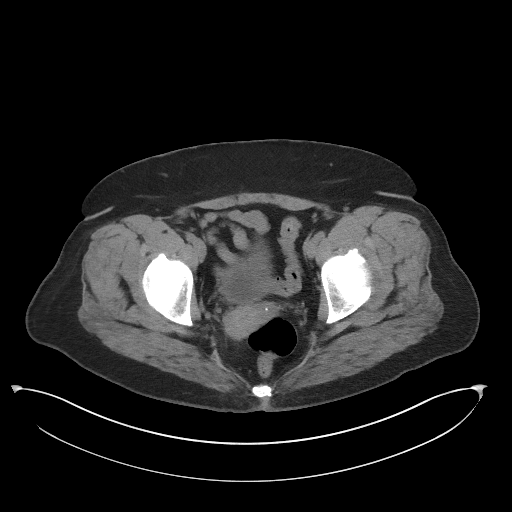
[im 30/103  soft-tissue]
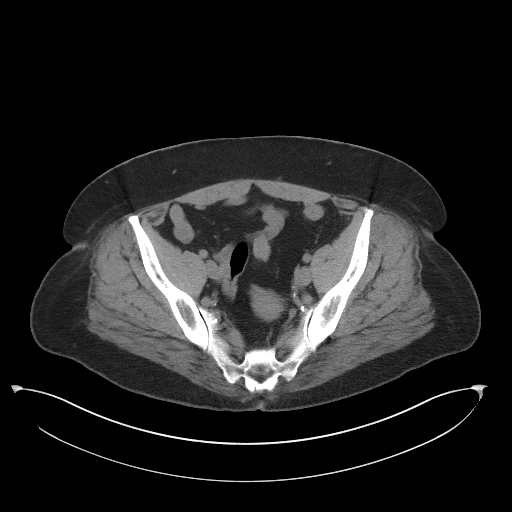
[im 35/103  soft-tissue]
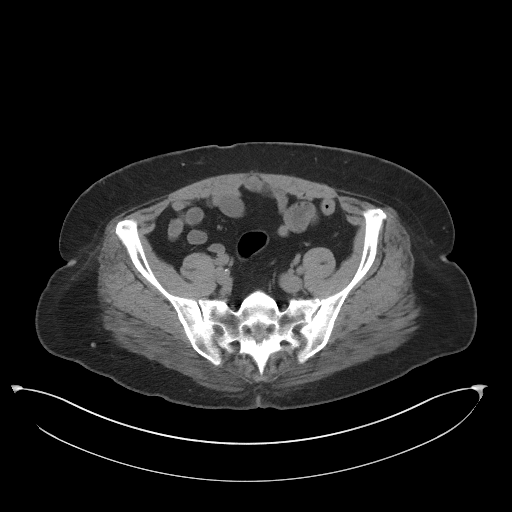
[im 43/103  soft-tissue]
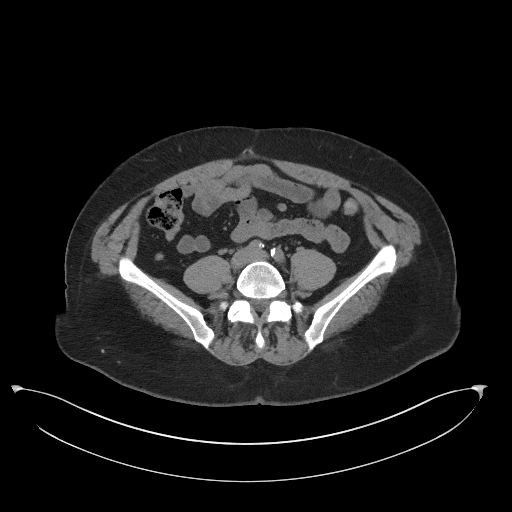
[im 52/103  soft-tissue]
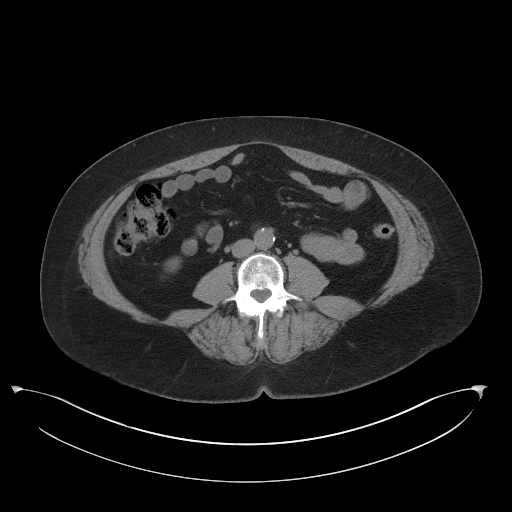
[im 60/103  soft-tissue]
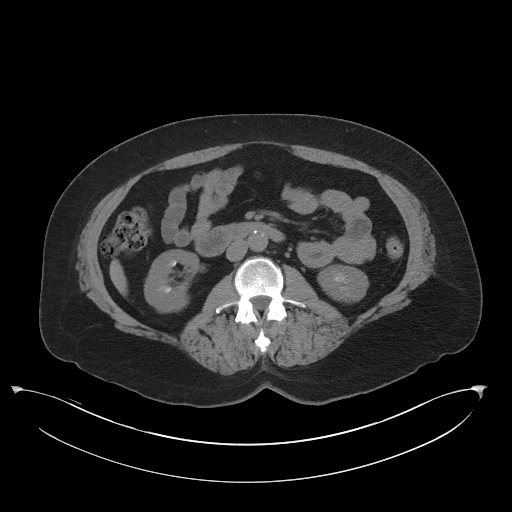
[im 69/103  soft-tissue]
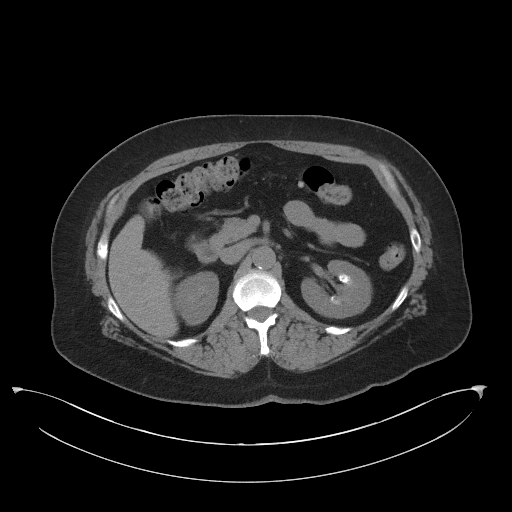
[im 69/103  bone]
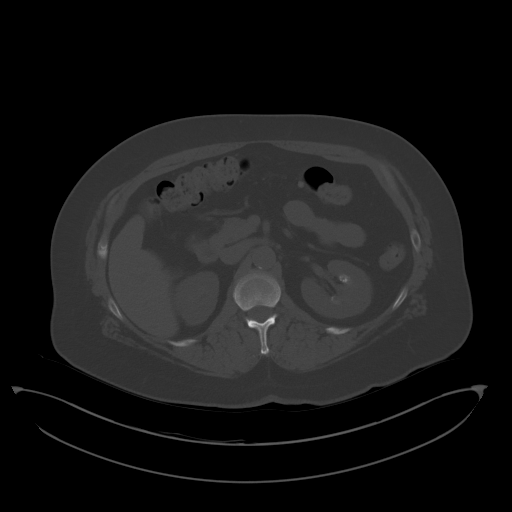
[im 73/103  soft-tissue]
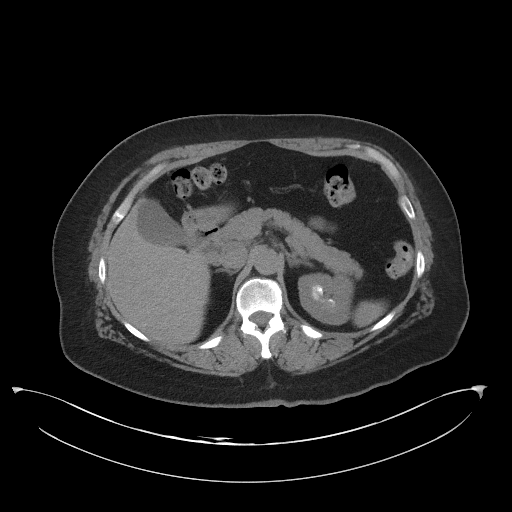
[im 81/103  soft-tissue]
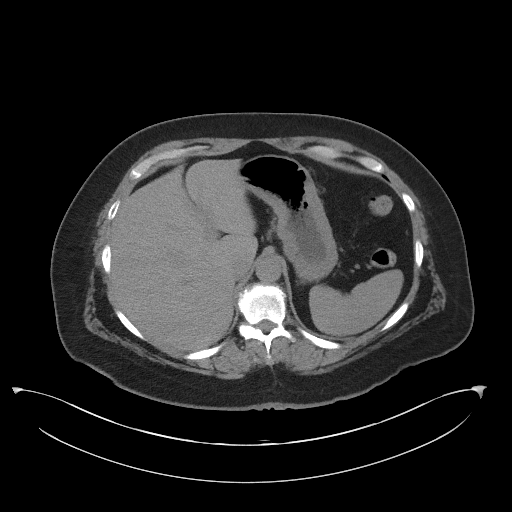
[im 90/103  soft-tissue]
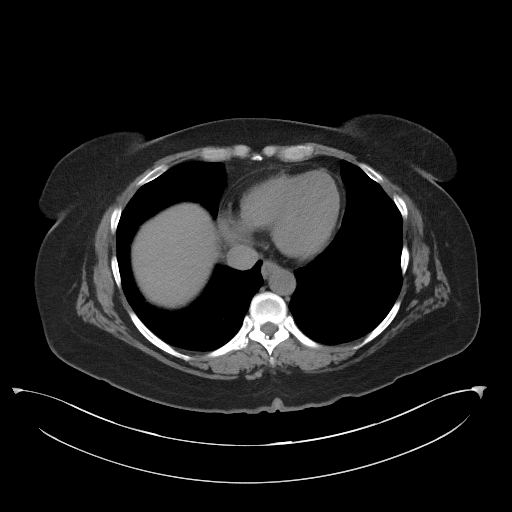
[im 98/103  soft-tissue]
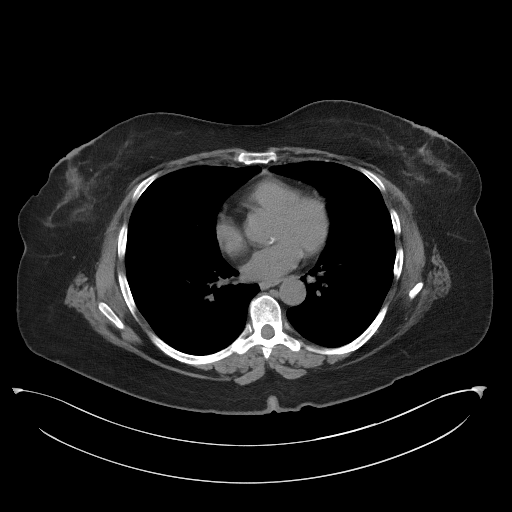

[Series 5: coronal st · coronal · 1.02mm/px · 3 of 101 slices shown]
[im 34/101  soft-tissue]
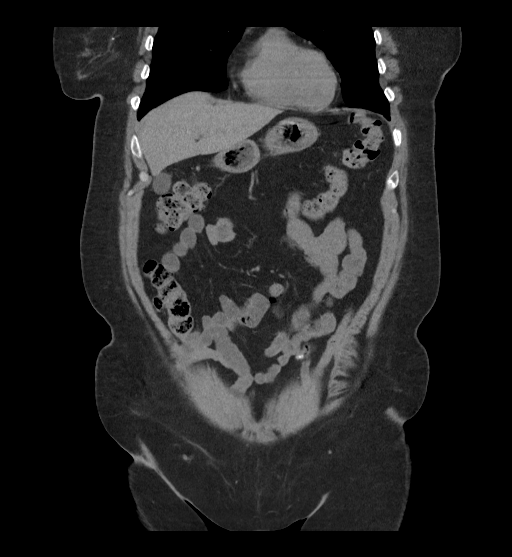
[im 45/101  soft-tissue]
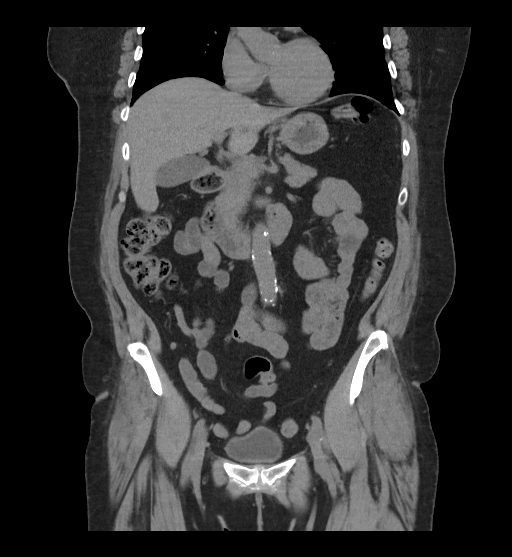
[im 56/101  soft-tissue]
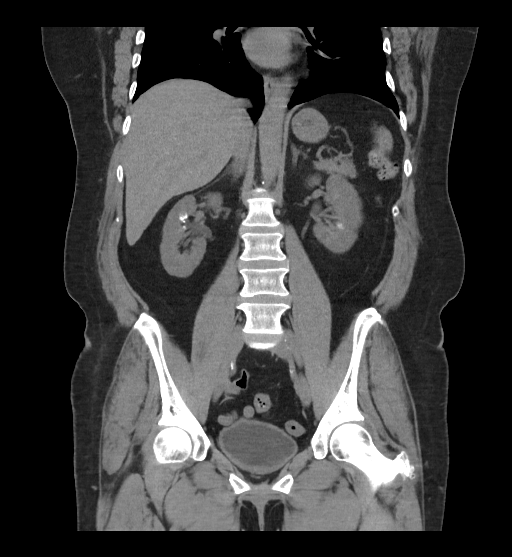

[16 of 46 positions shown; findings below may reference images not displayed]

FINDINGS: Lower chest: No acute abnormality.

Hepatobiliary: No focal liver abnormality is seen. No gallstones,
gallbladder wall thickening, or biliary dilatation.

Pancreas: Unremarkable. No pancreatic ductal dilatation or
surrounding inflammatory changes.

Spleen: Normal in size without focal abnormality.

Adrenals/Urinary Tract: There is bilateral medullary
nephrocalcinosis. Probable associated RIGHT renal stone measures 4
mm. No hydronephrosis bilaterally. No ureteral or bladder calculi.
Bladder is partially decompressed limiting characterization of the
bladder walls, however, I suspect at least mild circumferential
thickening of the walls of the bladder.

Stomach/Bowel: No dilated large or small bowel loops. No evidence of
bowel wall inflammation. Scattered mild diverticulosis without
evidence of acute diverticulitis. Appendix is within normal limits
in size and there is no periappendiceal inflammation. Stomach is
unremarkable, partially decompressed.

Vascular/Lymphatic: Aortic atherosclerosis. No enlarged lymph nodes
seen in the abdomen or pelvis.

Reproductive: Uterus and bilateral adnexa are unremarkable.

Other: No free fluid or abscess collection. No free intraperitoneal
air.

Musculoskeletal: Mild degenerative spondylosis within the thoracic
and lumbar spine. No acute or suspicious osseous finding.
IMPRESSION: 1. Bilateral medullary nephrocalcinosis. Associated nonobstructing 4
mm right renal stone. No hydronephrosis bilaterally. No ureteral or
bladder calculi seen.
2. Bladder is partially decompressed limiting characterization of
the bladder walls, however, I suspect at least mild circumferential
thickening of the walls of the bladder. Recommend correlation with
urinalysis to exclude associated cystitis.
3. Mild colonic diverticulosis without evidence of acute
diverticulitis.

Aortic Atherosclerosis ([id number]).

## 2020-11-14 ENCOUNTER — Other Ambulatory Visit (HOSPITAL_BASED_OUTPATIENT_CLINIC_OR_DEPARTMENT_OTHER): Payer: Self-pay

## 2020-11-14 MED FILL — Lamotrigine Tab 100 MG: ORAL | 30 days supply | Qty: 120 | Fill #2 | Status: AC

## 2020-11-14 MED FILL — Sertraline HCl Tab 100 MG: ORAL | 90 days supply | Qty: 90 | Fill #0 | Status: AC

## 2020-12-16 ENCOUNTER — Other Ambulatory Visit (HOSPITAL_BASED_OUTPATIENT_CLINIC_OR_DEPARTMENT_OTHER): Payer: Self-pay

## 2020-12-16 DIAGNOSIS — G43019 Migraine without aura, intractable, without status migrainosus: Secondary | ICD-10-CM | POA: Diagnosis not present

## 2020-12-16 DIAGNOSIS — G43719 Chronic migraine without aura, intractable, without status migrainosus: Secondary | ICD-10-CM | POA: Diagnosis not present

## 2020-12-16 DIAGNOSIS — R519 Headache, unspecified: Secondary | ICD-10-CM | POA: Diagnosis not present

## 2020-12-16 MED ORDER — LAMOTRIGINE 100 MG PO TABS
ORAL_TABLET | ORAL | 5 refills | Status: DC
  Filled 2020-12-16 – 2021-06-12 (×2): qty 120, 30d supply, fill #0
  Filled 2021-08-21: qty 120, 30d supply, fill #1
  Filled 2021-10-22: qty 120, 30d supply, fill #2
  Filled 2021-12-07: qty 120, 30d supply, fill #3

## 2020-12-30 ENCOUNTER — Other Ambulatory Visit (HOSPITAL_BASED_OUTPATIENT_CLINIC_OR_DEPARTMENT_OTHER): Payer: Self-pay

## 2021-01-12 ENCOUNTER — Other Ambulatory Visit (HOSPITAL_BASED_OUTPATIENT_CLINIC_OR_DEPARTMENT_OTHER): Payer: Self-pay

## 2021-01-12 MED FILL — Lamotrigine Tab 100 MG: ORAL | 30 days supply | Qty: 120 | Fill #3 | Status: AC

## 2021-02-02 ENCOUNTER — Other Ambulatory Visit (HOSPITAL_BASED_OUTPATIENT_CLINIC_OR_DEPARTMENT_OTHER): Payer: Self-pay

## 2021-02-02 DIAGNOSIS — J029 Acute pharyngitis, unspecified: Secondary | ICD-10-CM | POA: Diagnosis not present

## 2021-02-02 MED ORDER — AMOXICILLIN 500 MG PO CAPS
ORAL_CAPSULE | ORAL | 0 refills | Status: DC
  Filled 2021-02-02: qty 10, 5d supply, fill #0

## 2021-02-04 ENCOUNTER — Other Ambulatory Visit (HOSPITAL_BASED_OUTPATIENT_CLINIC_OR_DEPARTMENT_OTHER): Payer: Self-pay

## 2021-02-05 ENCOUNTER — Other Ambulatory Visit (HOSPITAL_BASED_OUTPATIENT_CLINIC_OR_DEPARTMENT_OTHER): Payer: Self-pay

## 2021-02-05 MED ORDER — PROMETHAZINE HCL 25 MG PO TABS
ORAL_TABLET | ORAL | 0 refills | Status: DC
  Filled 2021-02-05: qty 30, 8d supply, fill #0
  Filled 2021-02-25: qty 30, 7d supply, fill #0

## 2021-02-17 ENCOUNTER — Other Ambulatory Visit (HOSPITAL_BASED_OUTPATIENT_CLINIC_OR_DEPARTMENT_OTHER): Payer: Self-pay

## 2021-02-25 ENCOUNTER — Other Ambulatory Visit (HOSPITAL_BASED_OUTPATIENT_CLINIC_OR_DEPARTMENT_OTHER): Payer: Self-pay

## 2021-02-26 ENCOUNTER — Other Ambulatory Visit (HOSPITAL_BASED_OUTPATIENT_CLINIC_OR_DEPARTMENT_OTHER): Payer: Self-pay

## 2021-02-26 MED ORDER — LISINOPRIL-HYDROCHLOROTHIAZIDE 10-12.5 MG PO TABS
1.0000 | ORAL_TABLET | Freq: Every day | ORAL | 1 refills | Status: DC
  Filled 2021-02-26: qty 90, 90d supply, fill #0
  Filled 2021-06-12: qty 90, 90d supply, fill #1

## 2021-03-26 ENCOUNTER — Other Ambulatory Visit (HOSPITAL_BASED_OUTPATIENT_CLINIC_OR_DEPARTMENT_OTHER): Payer: Self-pay

## 2021-03-26 MED FILL — Lamotrigine Tab 100 MG: ORAL | 30 days supply | Qty: 120 | Fill #4 | Status: AC

## 2021-05-04 ENCOUNTER — Other Ambulatory Visit (HOSPITAL_BASED_OUTPATIENT_CLINIC_OR_DEPARTMENT_OTHER): Payer: Self-pay

## 2021-05-05 ENCOUNTER — Other Ambulatory Visit (HOSPITAL_BASED_OUTPATIENT_CLINIC_OR_DEPARTMENT_OTHER): Payer: Self-pay

## 2021-05-06 ENCOUNTER — Other Ambulatory Visit (HOSPITAL_BASED_OUTPATIENT_CLINIC_OR_DEPARTMENT_OTHER): Payer: Self-pay

## 2021-05-06 DIAGNOSIS — I10 Essential (primary) hypertension: Secondary | ICD-10-CM | POA: Diagnosis not present

## 2021-05-06 DIAGNOSIS — Z720 Tobacco use: Secondary | ICD-10-CM | POA: Diagnosis not present

## 2021-05-06 DIAGNOSIS — E782 Mixed hyperlipidemia: Secondary | ICD-10-CM | POA: Diagnosis not present

## 2021-05-06 DIAGNOSIS — G43909 Migraine, unspecified, not intractable, without status migrainosus: Secondary | ICD-10-CM | POA: Diagnosis not present

## 2021-05-06 MED ORDER — PROMETHAZINE HCL 25 MG PO TABS
ORAL_TABLET | ORAL | 1 refills | Status: DC
  Filled 2021-05-06: qty 30, 7d supply, fill #0
  Filled 2021-08-21: qty 30, 7d supply, fill #1

## 2021-05-06 MED ORDER — CYCLOBENZAPRINE HCL 10 MG PO TABS
ORAL_TABLET | ORAL | 0 refills | Status: DC
  Filled 2021-05-06: qty 270, 90d supply, fill #0

## 2021-05-06 MED ORDER — ATORVASTATIN CALCIUM 40 MG PO TABS
ORAL_TABLET | ORAL | 1 refills | Status: DC
  Filled 2021-05-06: qty 90, 90d supply, fill #0
  Filled 2021-08-21: qty 90, 90d supply, fill #1

## 2021-06-12 ENCOUNTER — Other Ambulatory Visit (HOSPITAL_BASED_OUTPATIENT_CLINIC_OR_DEPARTMENT_OTHER): Payer: Self-pay

## 2021-06-25 ENCOUNTER — Other Ambulatory Visit (HOSPITAL_BASED_OUTPATIENT_CLINIC_OR_DEPARTMENT_OTHER): Payer: Self-pay

## 2021-06-25 DIAGNOSIS — R1012 Left upper quadrant pain: Secondary | ICD-10-CM | POA: Diagnosis not present

## 2021-06-25 MED ORDER — PANTOPRAZOLE SODIUM 40 MG PO TBEC
DELAYED_RELEASE_TABLET | ORAL | 1 refills | Status: DC
  Filled 2021-06-25: qty 90, 90d supply, fill #0
  Filled 2021-12-07: qty 90, 90d supply, fill #1

## 2021-06-26 ENCOUNTER — Other Ambulatory Visit (HOSPITAL_BASED_OUTPATIENT_CLINIC_OR_DEPARTMENT_OTHER): Payer: Self-pay

## 2021-06-26 DIAGNOSIS — Z1211 Encounter for screening for malignant neoplasm of colon: Secondary | ICD-10-CM | POA: Diagnosis not present

## 2021-06-26 DIAGNOSIS — R1012 Left upper quadrant pain: Secondary | ICD-10-CM | POA: Diagnosis not present

## 2021-07-03 DIAGNOSIS — K2270 Barrett's esophagus without dysplasia: Secondary | ICD-10-CM | POA: Diagnosis not present

## 2021-08-20 DIAGNOSIS — Z1211 Encounter for screening for malignant neoplasm of colon: Secondary | ICD-10-CM | POA: Diagnosis not present

## 2021-08-20 DIAGNOSIS — K259 Gastric ulcer, unspecified as acute or chronic, without hemorrhage or perforation: Secondary | ICD-10-CM | POA: Diagnosis not present

## 2021-08-21 ENCOUNTER — Other Ambulatory Visit (HOSPITAL_BASED_OUTPATIENT_CLINIC_OR_DEPARTMENT_OTHER): Payer: Self-pay

## 2021-08-21 MED ORDER — LISINOPRIL-HYDROCHLOROTHIAZIDE 10-12.5 MG PO TABS
1.0000 | ORAL_TABLET | Freq: Every day | ORAL | 1 refills | Status: DC
  Filled 2021-08-21: qty 90, 90d supply, fill #0
  Filled 2021-12-07: qty 90, 90d supply, fill #1

## 2021-08-21 MED ORDER — CARVEDILOL 12.5 MG PO TABS
ORAL_TABLET | ORAL | 1 refills | Status: DC
  Filled 2021-08-21: qty 180, 90d supply, fill #0
  Filled 2021-12-07: qty 180, 90d supply, fill #1

## 2021-08-21 MED ORDER — SERTRALINE HCL 100 MG PO TABS
100.0000 mg | ORAL_TABLET | Freq: Every day | ORAL | 0 refills | Status: DC
  Filled 2021-08-21: qty 90, 90d supply, fill #0

## 2021-08-28 ENCOUNTER — Other Ambulatory Visit (HOSPITAL_BASED_OUTPATIENT_CLINIC_OR_DEPARTMENT_OTHER): Payer: Self-pay

## 2021-10-16 DIAGNOSIS — N390 Urinary tract infection, site not specified: Secondary | ICD-10-CM | POA: Diagnosis not present

## 2021-10-22 ENCOUNTER — Other Ambulatory Visit (HOSPITAL_BASED_OUTPATIENT_CLINIC_OR_DEPARTMENT_OTHER): Payer: Self-pay

## 2021-12-07 ENCOUNTER — Other Ambulatory Visit (HOSPITAL_BASED_OUTPATIENT_CLINIC_OR_DEPARTMENT_OTHER): Payer: Self-pay

## 2021-12-08 ENCOUNTER — Other Ambulatory Visit (HOSPITAL_BASED_OUTPATIENT_CLINIC_OR_DEPARTMENT_OTHER): Payer: Self-pay

## 2021-12-08 MED ORDER — ATORVASTATIN CALCIUM 40 MG PO TABS
ORAL_TABLET | ORAL | 1 refills | Status: DC
  Filled 2021-12-08: qty 90, 90d supply, fill #0
  Filled 2022-04-16: qty 90, 90d supply, fill #1

## 2021-12-08 MED ORDER — SERTRALINE HCL 100 MG PO TABS
100.0000 mg | ORAL_TABLET | Freq: Every day | ORAL | 1 refills | Status: DC
  Filled 2021-12-08: qty 90, 90d supply, fill #0
  Filled 2022-04-16: qty 90, 90d supply, fill #1

## 2021-12-08 MED ORDER — PROMETHAZINE HCL 25 MG PO TABS
25.0000 mg | ORAL_TABLET | Freq: Four times a day (QID) | ORAL | 1 refills | Status: DC | PRN
  Filled 2021-12-08: qty 30, 8d supply, fill #0
  Filled 2022-02-18: qty 30, 8d supply, fill #1

## 2022-01-07 DIAGNOSIS — Z01419 Encounter for gynecological examination (general) (routine) without abnormal findings: Secondary | ICD-10-CM | POA: Diagnosis not present

## 2022-01-07 DIAGNOSIS — Z1231 Encounter for screening mammogram for malignant neoplasm of breast: Secondary | ICD-10-CM | POA: Diagnosis not present

## 2022-02-18 ENCOUNTER — Other Ambulatory Visit (HOSPITAL_BASED_OUTPATIENT_CLINIC_OR_DEPARTMENT_OTHER): Payer: Self-pay

## 2022-02-18 MED ORDER — LAMOTRIGINE 100 MG PO TABS
400.0000 mg | ORAL_TABLET | Freq: Every day | ORAL | 0 refills | Status: DC
  Filled 2022-02-18: qty 28, 7d supply, fill #0

## 2022-02-22 ENCOUNTER — Other Ambulatory Visit (HOSPITAL_BASED_OUTPATIENT_CLINIC_OR_DEPARTMENT_OTHER): Payer: Self-pay

## 2022-02-22 DIAGNOSIS — G43019 Migraine without aura, intractable, without status migrainosus: Secondary | ICD-10-CM | POA: Diagnosis not present

## 2022-02-22 MED ORDER — LAMOTRIGINE 100 MG PO TABS
400.0000 mg | ORAL_TABLET | Freq: Every day | ORAL | 5 refills | Status: DC
  Filled 2022-02-22 – 2022-03-09 (×3): qty 120, 30d supply, fill #0
  Filled 2022-05-25: qty 120, 30d supply, fill #1
  Filled 2022-06-23: qty 120, 30d supply, fill #2
  Filled 2022-07-30: qty 120, 30d supply, fill #3
  Filled 2022-08-31: qty 120, 30d supply, fill #4
  Filled 2022-11-18: qty 120, 30d supply, fill #5

## 2022-02-23 ENCOUNTER — Other Ambulatory Visit (HOSPITAL_BASED_OUTPATIENT_CLINIC_OR_DEPARTMENT_OTHER): Payer: Self-pay

## 2022-03-04 ENCOUNTER — Other Ambulatory Visit (HOSPITAL_BASED_OUTPATIENT_CLINIC_OR_DEPARTMENT_OTHER): Payer: Self-pay

## 2022-03-09 ENCOUNTER — Other Ambulatory Visit (HOSPITAL_BASED_OUTPATIENT_CLINIC_OR_DEPARTMENT_OTHER): Payer: Self-pay

## 2022-04-16 ENCOUNTER — Other Ambulatory Visit (HOSPITAL_BASED_OUTPATIENT_CLINIC_OR_DEPARTMENT_OTHER): Payer: Self-pay

## 2022-04-19 DIAGNOSIS — Z9049 Acquired absence of other specified parts of digestive tract: Secondary | ICD-10-CM | POA: Diagnosis not present

## 2022-04-19 DIAGNOSIS — R1012 Left upper quadrant pain: Secondary | ICD-10-CM | POA: Diagnosis not present

## 2022-04-19 DIAGNOSIS — J111 Influenza due to unidentified influenza virus with other respiratory manifestations: Secondary | ICD-10-CM | POA: Diagnosis not present

## 2022-04-19 DIAGNOSIS — Z8673 Personal history of transient ischemic attack (TIA), and cerebral infarction without residual deficits: Secondary | ICD-10-CM | POA: Diagnosis not present

## 2022-04-19 DIAGNOSIS — I10 Essential (primary) hypertension: Secondary | ICD-10-CM | POA: Diagnosis not present

## 2022-04-19 DIAGNOSIS — Z7982 Long term (current) use of aspirin: Secondary | ICD-10-CM | POA: Diagnosis not present

## 2022-04-19 DIAGNOSIS — Z79899 Other long term (current) drug therapy: Secondary | ICD-10-CM | POA: Diagnosis not present

## 2022-04-19 DIAGNOSIS — N289 Disorder of kidney and ureter, unspecified: Secondary | ICD-10-CM | POA: Diagnosis not present

## 2022-04-19 DIAGNOSIS — J101 Influenza due to other identified influenza virus with other respiratory manifestations: Secondary | ICD-10-CM | POA: Diagnosis not present

## 2022-04-19 DIAGNOSIS — Z885 Allergy status to narcotic agent status: Secondary | ICD-10-CM | POA: Diagnosis not present

## 2022-04-19 DIAGNOSIS — G8929 Other chronic pain: Secondary | ICD-10-CM | POA: Diagnosis not present

## 2022-04-19 DIAGNOSIS — Z888 Allergy status to other drugs, medicaments and biological substances status: Secondary | ICD-10-CM | POA: Diagnosis not present

## 2022-04-19 DIAGNOSIS — F17210 Nicotine dependence, cigarettes, uncomplicated: Secondary | ICD-10-CM | POA: Diagnosis not present

## 2022-04-19 DIAGNOSIS — N281 Cyst of kidney, acquired: Secondary | ICD-10-CM | POA: Diagnosis not present

## 2022-04-19 DIAGNOSIS — R9431 Abnormal electrocardiogram [ECG] [EKG]: Secondary | ICD-10-CM | POA: Diagnosis not present

## 2022-04-19 DIAGNOSIS — R1084 Generalized abdominal pain: Secondary | ICD-10-CM | POA: Diagnosis not present

## 2022-04-19 DIAGNOSIS — N29 Other disorders of kidney and ureter in diseases classified elsewhere: Secondary | ICD-10-CM | POA: Diagnosis not present

## 2022-04-19 DIAGNOSIS — R000 Tachycardia, unspecified: Secondary | ICD-10-CM | POA: Diagnosis not present

## 2022-04-19 DIAGNOSIS — R0781 Pleurodynia: Secondary | ICD-10-CM | POA: Diagnosis not present

## 2022-04-19 DIAGNOSIS — R079 Chest pain, unspecified: Secondary | ICD-10-CM | POA: Diagnosis not present

## 2022-04-20 ENCOUNTER — Other Ambulatory Visit (HOSPITAL_BASED_OUTPATIENT_CLINIC_OR_DEPARTMENT_OTHER): Payer: Self-pay

## 2022-04-20 DIAGNOSIS — Z1211 Encounter for screening for malignant neoplasm of colon: Secondary | ICD-10-CM | POA: Diagnosis not present

## 2022-04-20 DIAGNOSIS — R0789 Other chest pain: Secondary | ICD-10-CM | POA: Diagnosis not present

## 2022-04-20 DIAGNOSIS — Z720 Tobacco use: Secondary | ICD-10-CM | POA: Diagnosis not present

## 2022-04-20 MED ORDER — LIDOCAINE 5 % EX PTCH
MEDICATED_PATCH | CUTANEOUS | 0 refills | Status: DC
  Filled 2022-04-20: qty 30, 30d supply, fill #0

## 2022-04-21 ENCOUNTER — Other Ambulatory Visit: Payer: Self-pay | Admitting: Family Medicine

## 2022-04-21 ENCOUNTER — Other Ambulatory Visit (HOSPITAL_BASED_OUTPATIENT_CLINIC_OR_DEPARTMENT_OTHER): Payer: Self-pay

## 2022-04-21 DIAGNOSIS — R0789 Other chest pain: Secondary | ICD-10-CM

## 2022-04-22 ENCOUNTER — Other Ambulatory Visit (HOSPITAL_BASED_OUTPATIENT_CLINIC_OR_DEPARTMENT_OTHER): Payer: Self-pay

## 2022-04-23 ENCOUNTER — Other Ambulatory Visit (HOSPITAL_BASED_OUTPATIENT_CLINIC_OR_DEPARTMENT_OTHER): Payer: Self-pay

## 2022-04-23 ENCOUNTER — Ambulatory Visit (INDEPENDENT_AMBULATORY_CARE_PROVIDER_SITE_OTHER): Payer: BC Managed Care – PPO

## 2022-04-23 DIAGNOSIS — R0789 Other chest pain: Secondary | ICD-10-CM

## 2022-04-23 DIAGNOSIS — M47814 Spondylosis without myelopathy or radiculopathy, thoracic region: Secondary | ICD-10-CM | POA: Diagnosis not present

## 2022-04-23 DIAGNOSIS — R079 Chest pain, unspecified: Secondary | ICD-10-CM | POA: Diagnosis not present

## 2022-04-23 DIAGNOSIS — M419 Scoliosis, unspecified: Secondary | ICD-10-CM | POA: Diagnosis not present

## 2022-04-23 MED ORDER — TRAMADOL HCL 50 MG PO TABS
50.0000 mg | ORAL_TABLET | Freq: Every day | ORAL | 0 refills | Status: DC
  Filled 2022-04-23: qty 1, 1d supply, fill #0

## 2022-04-29 ENCOUNTER — Other Ambulatory Visit (HOSPITAL_BASED_OUTPATIENT_CLINIC_OR_DEPARTMENT_OTHER): Payer: Self-pay

## 2022-04-29 ENCOUNTER — Other Ambulatory Visit: Payer: Self-pay | Admitting: Family Medicine

## 2022-04-29 DIAGNOSIS — Z720 Tobacco use: Secondary | ICD-10-CM

## 2022-04-30 ENCOUNTER — Other Ambulatory Visit (HOSPITAL_BASED_OUTPATIENT_CLINIC_OR_DEPARTMENT_OTHER): Payer: Self-pay

## 2022-04-30 MED ORDER — PROMETHAZINE HCL 25 MG PO TABS
25.0000 mg | ORAL_TABLET | Freq: Four times a day (QID) | ORAL | 2 refills | Status: DC | PRN
  Filled 2022-04-30: qty 30, 8d supply, fill #0
  Filled 2022-06-23: qty 30, 8d supply, fill #1
  Filled 2022-11-18: qty 30, 8d supply, fill #2

## 2022-05-05 DIAGNOSIS — R1084 Generalized abdominal pain: Secondary | ICD-10-CM | POA: Diagnosis not present

## 2022-05-06 ENCOUNTER — Other Ambulatory Visit (HOSPITAL_BASED_OUTPATIENT_CLINIC_OR_DEPARTMENT_OTHER): Payer: Self-pay

## 2022-05-11 ENCOUNTER — Ambulatory Visit (INDEPENDENT_AMBULATORY_CARE_PROVIDER_SITE_OTHER): Payer: BC Managed Care – PPO

## 2022-05-11 ENCOUNTER — Other Ambulatory Visit (HOSPITAL_BASED_OUTPATIENT_CLINIC_OR_DEPARTMENT_OTHER): Payer: Self-pay

## 2022-05-11 DIAGNOSIS — Z87891 Personal history of nicotine dependence: Secondary | ICD-10-CM | POA: Diagnosis not present

## 2022-05-11 DIAGNOSIS — F17210 Nicotine dependence, cigarettes, uncomplicated: Secondary | ICD-10-CM | POA: Diagnosis not present

## 2022-05-11 DIAGNOSIS — Z720 Tobacco use: Secondary | ICD-10-CM

## 2022-05-12 ENCOUNTER — Other Ambulatory Visit (HOSPITAL_BASED_OUTPATIENT_CLINIC_OR_DEPARTMENT_OTHER): Payer: Self-pay

## 2022-05-13 ENCOUNTER — Other Ambulatory Visit (HOSPITAL_BASED_OUTPATIENT_CLINIC_OR_DEPARTMENT_OTHER): Payer: Self-pay

## 2022-05-13 MED ORDER — LISINOPRIL-HYDROCHLOROTHIAZIDE 10-12.5 MG PO TABS
2.0000 | ORAL_TABLET | Freq: Every day | ORAL | 0 refills | Status: DC
  Filled 2022-05-13: qty 180, 90d supply, fill #0

## 2022-05-17 ENCOUNTER — Other Ambulatory Visit (HOSPITAL_BASED_OUTPATIENT_CLINIC_OR_DEPARTMENT_OTHER): Payer: Self-pay

## 2022-05-18 ENCOUNTER — Other Ambulatory Visit (HOSPITAL_BASED_OUTPATIENT_CLINIC_OR_DEPARTMENT_OTHER): Payer: Self-pay

## 2022-05-19 ENCOUNTER — Other Ambulatory Visit (HOSPITAL_BASED_OUTPATIENT_CLINIC_OR_DEPARTMENT_OTHER): Payer: Self-pay

## 2022-05-20 ENCOUNTER — Other Ambulatory Visit (HOSPITAL_BASED_OUTPATIENT_CLINIC_OR_DEPARTMENT_OTHER): Payer: Self-pay

## 2022-05-21 ENCOUNTER — Other Ambulatory Visit (HOSPITAL_BASED_OUTPATIENT_CLINIC_OR_DEPARTMENT_OTHER): Payer: Self-pay

## 2022-05-24 ENCOUNTER — Other Ambulatory Visit (HOSPITAL_BASED_OUTPATIENT_CLINIC_OR_DEPARTMENT_OTHER): Payer: Self-pay

## 2022-05-25 ENCOUNTER — Other Ambulatory Visit (HOSPITAL_BASED_OUTPATIENT_CLINIC_OR_DEPARTMENT_OTHER): Payer: Self-pay

## 2022-05-25 DIAGNOSIS — R0781 Pleurodynia: Secondary | ICD-10-CM | POA: Diagnosis not present

## 2022-05-25 DIAGNOSIS — Z8711 Personal history of peptic ulcer disease: Secondary | ICD-10-CM | POA: Diagnosis not present

## 2022-05-25 DIAGNOSIS — Z1211 Encounter for screening for malignant neoplasm of colon: Secondary | ICD-10-CM | POA: Diagnosis not present

## 2022-05-25 MED ORDER — PANTOPRAZOLE SODIUM 40 MG PO TBEC
40.0000 mg | DELAYED_RELEASE_TABLET | Freq: Every morning | ORAL | 1 refills | Status: AC
  Filled 2022-05-25 – 2022-08-31 (×3): qty 90, 90d supply, fill #0
  Filled 2022-12-31 (×2): qty 90, 90d supply, fill #1

## 2022-05-25 MED ORDER — PANTOPRAZOLE SODIUM 40 MG PO TBEC
40.0000 mg | DELAYED_RELEASE_TABLET | Freq: Every morning | ORAL | 1 refills | Status: DC
  Filled 2022-05-25: qty 90, 90d supply, fill #0
  Filled 2023-05-12: qty 90, 90d supply, fill #1

## 2022-05-27 ENCOUNTER — Other Ambulatory Visit (HOSPITAL_BASED_OUTPATIENT_CLINIC_OR_DEPARTMENT_OTHER): Payer: Self-pay

## 2022-05-31 ENCOUNTER — Other Ambulatory Visit (HOSPITAL_BASED_OUTPATIENT_CLINIC_OR_DEPARTMENT_OTHER): Payer: Self-pay

## 2022-06-01 ENCOUNTER — Other Ambulatory Visit (HOSPITAL_BASED_OUTPATIENT_CLINIC_OR_DEPARTMENT_OTHER): Payer: Self-pay

## 2022-06-02 ENCOUNTER — Other Ambulatory Visit (HOSPITAL_BASED_OUTPATIENT_CLINIC_OR_DEPARTMENT_OTHER): Payer: Self-pay

## 2022-06-03 ENCOUNTER — Other Ambulatory Visit (HOSPITAL_BASED_OUTPATIENT_CLINIC_OR_DEPARTMENT_OTHER): Payer: Self-pay

## 2022-06-08 ENCOUNTER — Other Ambulatory Visit (HOSPITAL_BASED_OUTPATIENT_CLINIC_OR_DEPARTMENT_OTHER): Payer: Self-pay

## 2022-06-10 ENCOUNTER — Other Ambulatory Visit (HOSPITAL_BASED_OUTPATIENT_CLINIC_OR_DEPARTMENT_OTHER): Payer: Self-pay

## 2022-06-14 ENCOUNTER — Other Ambulatory Visit (HOSPITAL_BASED_OUTPATIENT_CLINIC_OR_DEPARTMENT_OTHER): Payer: Self-pay

## 2022-06-15 ENCOUNTER — Other Ambulatory Visit (HOSPITAL_BASED_OUTPATIENT_CLINIC_OR_DEPARTMENT_OTHER): Payer: Self-pay

## 2022-06-17 ENCOUNTER — Other Ambulatory Visit (HOSPITAL_BASED_OUTPATIENT_CLINIC_OR_DEPARTMENT_OTHER): Payer: Self-pay

## 2022-06-20 DIAGNOSIS — F17210 Nicotine dependence, cigarettes, uncomplicated: Secondary | ICD-10-CM | POA: Diagnosis not present

## 2022-06-20 DIAGNOSIS — Z885 Allergy status to narcotic agent status: Secondary | ICD-10-CM | POA: Diagnosis not present

## 2022-06-20 DIAGNOSIS — K5730 Diverticulosis of large intestine without perforation or abscess without bleeding: Secondary | ICD-10-CM | POA: Diagnosis not present

## 2022-06-20 DIAGNOSIS — N3289 Other specified disorders of bladder: Secondary | ICD-10-CM | POA: Diagnosis not present

## 2022-06-20 DIAGNOSIS — Z888 Allergy status to other drugs, medicaments and biological substances status: Secondary | ICD-10-CM | POA: Diagnosis not present

## 2022-06-20 DIAGNOSIS — N12 Tubulo-interstitial nephritis, not specified as acute or chronic: Secondary | ICD-10-CM | POA: Diagnosis not present

## 2022-06-20 DIAGNOSIS — Z87442 Personal history of urinary calculi: Secondary | ICD-10-CM | POA: Diagnosis not present

## 2022-06-20 DIAGNOSIS — R112 Nausea with vomiting, unspecified: Secondary | ICD-10-CM | POA: Diagnosis not present

## 2022-06-20 DIAGNOSIS — Z7982 Long term (current) use of aspirin: Secondary | ICD-10-CM | POA: Diagnosis not present

## 2022-06-20 DIAGNOSIS — Z79899 Other long term (current) drug therapy: Secondary | ICD-10-CM | POA: Diagnosis not present

## 2022-06-20 DIAGNOSIS — R1030 Lower abdominal pain, unspecified: Secondary | ICD-10-CM | POA: Diagnosis not present

## 2022-06-20 DIAGNOSIS — N10 Acute tubulo-interstitial nephritis: Secondary | ICD-10-CM | POA: Diagnosis not present

## 2022-06-20 DIAGNOSIS — I10 Essential (primary) hypertension: Secondary | ICD-10-CM | POA: Diagnosis not present

## 2022-06-20 DIAGNOSIS — N281 Cyst of kidney, acquired: Secondary | ICD-10-CM | POA: Diagnosis not present

## 2022-06-21 ENCOUNTER — Other Ambulatory Visit (HOSPITAL_BASED_OUTPATIENT_CLINIC_OR_DEPARTMENT_OTHER): Payer: Self-pay

## 2022-06-21 MED ORDER — CEPHALEXIN 500 MG PO CAPS
500.0000 mg | ORAL_CAPSULE | Freq: Three times a day (TID) | ORAL | 0 refills | Status: DC
  Filled 2022-06-21: qty 30, 10d supply, fill #0

## 2022-06-21 MED ORDER — IBUPROFEN 600 MG PO TABS
600.0000 mg | ORAL_TABLET | Freq: Four times a day (QID) | ORAL | 0 refills | Status: DC | PRN
  Filled 2022-06-21: qty 20, 5d supply, fill #0

## 2022-06-21 MED ORDER — HYDROCODONE-ACETAMINOPHEN 5-325 MG PO TABS
1.0000 | ORAL_TABLET | Freq: Three times a day (TID) | ORAL | 0 refills | Status: DC | PRN
  Filled 2022-06-21: qty 10, 4d supply, fill #0

## 2022-06-21 MED ORDER — ONDANSETRON 4 MG PO TBDP
4.0000 mg | ORAL_TABLET | Freq: Three times a day (TID) | ORAL | 0 refills | Status: DC | PRN
  Filled 2022-06-21: qty 20, 7d supply, fill #0

## 2022-06-22 DIAGNOSIS — M545 Low back pain, unspecified: Secondary | ICD-10-CM | POA: Diagnosis not present

## 2022-06-22 DIAGNOSIS — Z7982 Long term (current) use of aspirin: Secondary | ICD-10-CM | POA: Diagnosis not present

## 2022-06-22 DIAGNOSIS — R109 Unspecified abdominal pain: Secondary | ICD-10-CM | POA: Diagnosis not present

## 2022-06-22 DIAGNOSIS — I10 Essential (primary) hypertension: Secondary | ICD-10-CM | POA: Diagnosis not present

## 2022-06-22 DIAGNOSIS — R112 Nausea with vomiting, unspecified: Secondary | ICD-10-CM | POA: Diagnosis not present

## 2022-06-22 DIAGNOSIS — Z8673 Personal history of transient ischemic attack (TIA), and cerebral infarction without residual deficits: Secondary | ICD-10-CM | POA: Diagnosis not present

## 2022-06-22 DIAGNOSIS — F17210 Nicotine dependence, cigarettes, uncomplicated: Secondary | ICD-10-CM | POA: Diagnosis not present

## 2022-06-22 DIAGNOSIS — Z79899 Other long term (current) drug therapy: Secondary | ICD-10-CM | POA: Diagnosis not present

## 2022-06-23 ENCOUNTER — Other Ambulatory Visit: Payer: Self-pay

## 2022-06-23 ENCOUNTER — Other Ambulatory Visit (HOSPITAL_BASED_OUTPATIENT_CLINIC_OR_DEPARTMENT_OTHER): Payer: Self-pay

## 2022-06-23 MED ORDER — CIPROFLOXACIN HCL 250 MG PO TABS
250.0000 mg | ORAL_TABLET | Freq: Two times a day (BID) | ORAL | 0 refills | Status: DC
  Filled 2022-06-23: qty 10, 5d supply, fill #0

## 2022-06-23 MED ORDER — CARVEDILOL 12.5 MG PO TABS
12.5000 mg | ORAL_TABLET | Freq: Two times a day (BID) | ORAL | 0 refills | Status: DC
  Filled 2022-06-23 – 2022-07-30 (×2): qty 180, 90d supply, fill #0

## 2022-06-25 DIAGNOSIS — Z8673 Personal history of transient ischemic attack (TIA), and cerebral infarction without residual deficits: Secondary | ICD-10-CM | POA: Diagnosis not present

## 2022-06-25 DIAGNOSIS — Z888 Allergy status to other drugs, medicaments and biological substances status: Secondary | ICD-10-CM | POA: Diagnosis not present

## 2022-06-25 DIAGNOSIS — M549 Dorsalgia, unspecified: Secondary | ICD-10-CM | POA: Diagnosis not present

## 2022-06-25 DIAGNOSIS — Z79899 Other long term (current) drug therapy: Secondary | ICD-10-CM | POA: Diagnosis not present

## 2022-06-25 DIAGNOSIS — N29 Other disorders of kidney and ureter in diseases classified elsewhere: Secondary | ICD-10-CM | POA: Diagnosis not present

## 2022-06-25 DIAGNOSIS — I10 Essential (primary) hypertension: Secondary | ICD-10-CM | POA: Diagnosis not present

## 2022-06-25 DIAGNOSIS — Z87442 Personal history of urinary calculi: Secondary | ICD-10-CM | POA: Diagnosis not present

## 2022-06-25 DIAGNOSIS — F17210 Nicotine dependence, cigarettes, uncomplicated: Secondary | ICD-10-CM | POA: Diagnosis not present

## 2022-06-25 DIAGNOSIS — R1031 Right lower quadrant pain: Secondary | ICD-10-CM | POA: Diagnosis not present

## 2022-06-28 ENCOUNTER — Other Ambulatory Visit (HOSPITAL_BASED_OUTPATIENT_CLINIC_OR_DEPARTMENT_OTHER): Payer: Self-pay

## 2022-06-28 MED ORDER — TAMSULOSIN HCL 0.4 MG PO CAPS
0.4000 mg | ORAL_CAPSULE | Freq: Every day | ORAL | 0 refills | Status: DC
  Filled 2022-06-28: qty 30, 30d supply, fill #0

## 2022-06-28 MED ORDER — KETOROLAC TROMETHAMINE 10 MG PO TABS
10.0000 mg | ORAL_TABLET | Freq: Four times a day (QID) | ORAL | 0 refills | Status: DC | PRN
  Filled 2022-06-28: qty 20, 5d supply, fill #0

## 2022-06-30 ENCOUNTER — Other Ambulatory Visit (HOSPITAL_BASED_OUTPATIENT_CLINIC_OR_DEPARTMENT_OTHER): Payer: Self-pay

## 2022-06-30 DIAGNOSIS — R8290 Unspecified abnormal findings in urine: Secondary | ICD-10-CM | POA: Diagnosis not present

## 2022-07-30 ENCOUNTER — Other Ambulatory Visit (HOSPITAL_BASED_OUTPATIENT_CLINIC_OR_DEPARTMENT_OTHER): Payer: Self-pay

## 2022-08-02 ENCOUNTER — Other Ambulatory Visit (HOSPITAL_BASED_OUTPATIENT_CLINIC_OR_DEPARTMENT_OTHER): Payer: Self-pay

## 2022-08-02 MED ORDER — ATORVASTATIN CALCIUM 40 MG PO TABS
40.0000 mg | ORAL_TABLET | Freq: Every evening | ORAL | 0 refills | Status: DC
  Filled 2022-08-02: qty 90, 90d supply, fill #0

## 2022-08-02 MED ORDER — SERTRALINE HCL 100 MG PO TABS
100.0000 mg | ORAL_TABLET | Freq: Every day | ORAL | 0 refills | Status: DC
  Filled 2022-08-02: qty 90, 90d supply, fill #0

## 2022-08-05 ENCOUNTER — Other Ambulatory Visit (HOSPITAL_BASED_OUTPATIENT_CLINIC_OR_DEPARTMENT_OTHER): Payer: Self-pay

## 2022-08-05 MED ORDER — SUTAB 1479-225-188 MG PO TABS
ORAL_TABLET | ORAL | 0 refills | Status: DC
  Filled 2022-08-05 – 2022-08-13 (×2): qty 24, 1d supply, fill #0

## 2022-08-12 ENCOUNTER — Other Ambulatory Visit (HOSPITAL_BASED_OUTPATIENT_CLINIC_OR_DEPARTMENT_OTHER): Payer: Self-pay

## 2022-08-13 ENCOUNTER — Other Ambulatory Visit (HOSPITAL_BASED_OUTPATIENT_CLINIC_OR_DEPARTMENT_OTHER): Payer: Self-pay

## 2022-08-31 ENCOUNTER — Other Ambulatory Visit: Payer: Self-pay

## 2022-08-31 ENCOUNTER — Other Ambulatory Visit (HOSPITAL_BASED_OUTPATIENT_CLINIC_OR_DEPARTMENT_OTHER): Payer: Self-pay

## 2022-08-31 MED ORDER — LISINOPRIL-HYDROCHLOROTHIAZIDE 10-12.5 MG PO TABS
2.0000 | ORAL_TABLET | Freq: Every day | ORAL | 0 refills | Status: DC
  Filled 2022-08-31: qty 180, 90d supply, fill #0

## 2022-09-14 DIAGNOSIS — K5730 Diverticulosis of large intestine without perforation or abscess without bleeding: Secondary | ICD-10-CM | POA: Diagnosis not present

## 2022-09-14 DIAGNOSIS — D125 Benign neoplasm of sigmoid colon: Secondary | ICD-10-CM | POA: Diagnosis not present

## 2022-09-14 DIAGNOSIS — Z1211 Encounter for screening for malignant neoplasm of colon: Secondary | ICD-10-CM | POA: Diagnosis not present

## 2022-09-14 DIAGNOSIS — D123 Benign neoplasm of transverse colon: Secondary | ICD-10-CM | POA: Diagnosis not present

## 2022-09-14 DIAGNOSIS — K635 Polyp of colon: Secondary | ICD-10-CM | POA: Diagnosis not present

## 2022-11-18 ENCOUNTER — Other Ambulatory Visit (HOSPITAL_BASED_OUTPATIENT_CLINIC_OR_DEPARTMENT_OTHER): Payer: Self-pay

## 2022-11-19 ENCOUNTER — Other Ambulatory Visit (HOSPITAL_BASED_OUTPATIENT_CLINIC_OR_DEPARTMENT_OTHER): Payer: Self-pay

## 2022-11-19 MED ORDER — SERTRALINE HCL 100 MG PO TABS
100.0000 mg | ORAL_TABLET | Freq: Every day | ORAL | 0 refills | Status: DC
  Filled 2022-11-19: qty 90, 90d supply, fill #0

## 2022-12-31 ENCOUNTER — Other Ambulatory Visit (HOSPITAL_BASED_OUTPATIENT_CLINIC_OR_DEPARTMENT_OTHER): Payer: Self-pay

## 2023-01-03 ENCOUNTER — Other Ambulatory Visit (HOSPITAL_BASED_OUTPATIENT_CLINIC_OR_DEPARTMENT_OTHER): Payer: Self-pay

## 2023-01-05 ENCOUNTER — Other Ambulatory Visit (HOSPITAL_BASED_OUTPATIENT_CLINIC_OR_DEPARTMENT_OTHER): Payer: Self-pay

## 2023-01-05 DIAGNOSIS — G44311 Acute post-traumatic headache, intractable: Secondary | ICD-10-CM | POA: Diagnosis not present

## 2023-01-05 DIAGNOSIS — R110 Nausea: Secondary | ICD-10-CM | POA: Diagnosis not present

## 2023-01-05 DIAGNOSIS — E782 Mixed hyperlipidemia: Secondary | ICD-10-CM | POA: Diagnosis not present

## 2023-01-05 DIAGNOSIS — I10 Essential (primary) hypertension: Secondary | ICD-10-CM | POA: Diagnosis not present

## 2023-01-05 MED ORDER — ATORVASTATIN CALCIUM 40 MG PO TABS
40.0000 mg | ORAL_TABLET | Freq: Every day | ORAL | 3 refills | Status: DC
  Filled 2023-01-05 – 2023-02-02 (×2): qty 90, 90d supply, fill #0
  Filled 2023-06-15: qty 90, 90d supply, fill #1
  Filled 2023-10-07 (×2): qty 90, 90d supply, fill #2

## 2023-01-05 MED ORDER — PROMETHAZINE HCL 25 MG PO TABS
25.0000 mg | ORAL_TABLET | Freq: Three times a day (TID) | ORAL | 2 refills | Status: DC | PRN
  Filled 2023-01-05 – 2023-02-02 (×2): qty 30, 10d supply, fill #0
  Filled 2023-07-08: qty 30, 10d supply, fill #1

## 2023-01-18 ENCOUNTER — Other Ambulatory Visit (HOSPITAL_BASED_OUTPATIENT_CLINIC_OR_DEPARTMENT_OTHER): Payer: Self-pay

## 2023-02-02 ENCOUNTER — Other Ambulatory Visit (HOSPITAL_BASED_OUTPATIENT_CLINIC_OR_DEPARTMENT_OTHER): Payer: Self-pay

## 2023-02-02 MED ORDER — AMOXICILLIN 500 MG PO CAPS
500.0000 mg | ORAL_CAPSULE | Freq: Three times a day (TID) | ORAL | 0 refills | Status: AC
  Filled 2023-02-02: qty 30, 10d supply, fill #0

## 2023-02-16 ENCOUNTER — Other Ambulatory Visit (HOSPITAL_BASED_OUTPATIENT_CLINIC_OR_DEPARTMENT_OTHER): Payer: Self-pay

## 2023-02-16 MED ORDER — AMOXICILLIN 500 MG PO CAPS
500.0000 mg | ORAL_CAPSULE | Freq: Three times a day (TID) | ORAL | 0 refills | Status: DC
  Filled 2023-02-16: qty 30, 10d supply, fill #0

## 2023-02-16 MED ORDER — METHYLPREDNISOLONE 4 MG PO TBPK
ORAL_TABLET | ORAL | 0 refills | Status: DC
  Filled 2023-02-16: qty 21, 6d supply, fill #0

## 2023-03-23 ENCOUNTER — Other Ambulatory Visit (HOSPITAL_BASED_OUTPATIENT_CLINIC_OR_DEPARTMENT_OTHER): Payer: Self-pay

## 2023-03-23 ENCOUNTER — Other Ambulatory Visit: Payer: Self-pay

## 2023-03-23 MED ORDER — LAMOTRIGINE 100 MG PO TABS
200.0000 mg | ORAL_TABLET | Freq: Every day | ORAL | 0 refills | Status: DC
  Filled 2023-03-23: qty 60, 30d supply, fill #0

## 2023-03-23 MED ORDER — CYCLOBENZAPRINE HCL 10 MG PO TABS
10.0000 mg | ORAL_TABLET | Freq: Three times a day (TID) | ORAL | 0 refills | Status: DC | PRN
  Filled 2023-03-23: qty 270, 90d supply, fill #0

## 2023-03-23 MED ORDER — SERTRALINE HCL 100 MG PO TABS
100.0000 mg | ORAL_TABLET | Freq: Every day | ORAL | 0 refills | Status: DC
  Filled 2023-03-23: qty 90, 90d supply, fill #0

## 2023-03-23 MED ORDER — CARVEDILOL 12.5 MG PO TABS
12.5000 mg | ORAL_TABLET | Freq: Two times a day (BID) | ORAL | 0 refills | Status: DC
  Filled 2023-03-23: qty 180, 90d supply, fill #0

## 2023-04-12 DIAGNOSIS — G43909 Migraine, unspecified, not intractable, without status migrainosus: Secondary | ICD-10-CM | POA: Diagnosis not present

## 2023-04-12 DIAGNOSIS — Z720 Tobacco use: Secondary | ICD-10-CM | POA: Diagnosis not present

## 2023-04-12 DIAGNOSIS — I10 Essential (primary) hypertension: Secondary | ICD-10-CM | POA: Diagnosis not present

## 2023-04-12 DIAGNOSIS — E782 Mixed hyperlipidemia: Secondary | ICD-10-CM | POA: Diagnosis not present

## 2023-04-26 ENCOUNTER — Other Ambulatory Visit (HOSPITAL_BASED_OUTPATIENT_CLINIC_OR_DEPARTMENT_OTHER): Payer: Self-pay

## 2023-04-26 DIAGNOSIS — G43719 Chronic migraine without aura, intractable, without status migrainosus: Secondary | ICD-10-CM | POA: Diagnosis not present

## 2023-04-26 MED ORDER — LAMOTRIGINE 100 MG PO TABS
200.0000 mg | ORAL_TABLET | Freq: Every day | ORAL | 2 refills | Status: DC
  Filled 2023-04-26: qty 60, 30d supply, fill #0
  Filled 2023-06-15 (×2): qty 60, 30d supply, fill #1

## 2023-04-28 ENCOUNTER — Other Ambulatory Visit (HOSPITAL_BASED_OUTPATIENT_CLINIC_OR_DEPARTMENT_OTHER): Payer: Self-pay

## 2023-04-28 ENCOUNTER — Other Ambulatory Visit: Payer: Self-pay

## 2023-04-28 DIAGNOSIS — N390 Urinary tract infection, site not specified: Secondary | ICD-10-CM | POA: Diagnosis not present

## 2023-04-28 MED ORDER — NITROFURANTOIN MONOHYD MACRO 100 MG PO CAPS
100.0000 mg | ORAL_CAPSULE | Freq: Two times a day (BID) | ORAL | 0 refills | Status: DC
  Filled 2023-04-28: qty 14, 7d supply, fill #0

## 2023-05-12 ENCOUNTER — Other Ambulatory Visit (HOSPITAL_BASED_OUTPATIENT_CLINIC_OR_DEPARTMENT_OTHER): Payer: Self-pay

## 2023-05-12 MED ORDER — EMGALITY 120 MG/ML ~~LOC~~ SOAJ
2.0000 mL | SUBCUTANEOUS | 0 refills | Status: DC
  Filled 2023-05-12: qty 2, 28d supply, fill #0

## 2023-05-16 ENCOUNTER — Other Ambulatory Visit (HOSPITAL_BASED_OUTPATIENT_CLINIC_OR_DEPARTMENT_OTHER): Payer: Self-pay

## 2023-05-17 ENCOUNTER — Other Ambulatory Visit (HOSPITAL_BASED_OUTPATIENT_CLINIC_OR_DEPARTMENT_OTHER): Payer: Self-pay

## 2023-05-17 DIAGNOSIS — G43909 Migraine, unspecified, not intractable, without status migrainosus: Secondary | ICD-10-CM | POA: Diagnosis not present

## 2023-05-19 ENCOUNTER — Other Ambulatory Visit (HOSPITAL_BASED_OUTPATIENT_CLINIC_OR_DEPARTMENT_OTHER): Payer: Self-pay

## 2023-05-19 MED ORDER — CARVEDILOL 25 MG PO TABS
25.0000 mg | ORAL_TABLET | Freq: Two times a day (BID) | ORAL | 0 refills | Status: DC
  Filled 2023-05-19 (×2): qty 180, 90d supply, fill #0

## 2023-05-24 ENCOUNTER — Other Ambulatory Visit: Payer: Self-pay | Admitting: Family Medicine

## 2023-05-24 DIAGNOSIS — Z122 Encounter for screening for malignant neoplasm of respiratory organs: Secondary | ICD-10-CM

## 2023-06-03 ENCOUNTER — Other Ambulatory Visit (HOSPITAL_BASED_OUTPATIENT_CLINIC_OR_DEPARTMENT_OTHER): Payer: Self-pay

## 2023-06-03 DIAGNOSIS — I10 Essential (primary) hypertension: Secondary | ICD-10-CM | POA: Diagnosis not present

## 2023-06-03 DIAGNOSIS — R350 Frequency of micturition: Secondary | ICD-10-CM | POA: Diagnosis not present

## 2023-06-03 DIAGNOSIS — Z720 Tobacco use: Secondary | ICD-10-CM | POA: Diagnosis not present

## 2023-06-03 MED ORDER — CIPROFLOXACIN HCL 250 MG PO TABS
250.0000 mg | ORAL_TABLET | Freq: Two times a day (BID) | ORAL | 0 refills | Status: DC
  Filled 2023-06-03: qty 10, 5d supply, fill #0

## 2023-06-03 MED ORDER — AMLODIPINE BESYLATE 5 MG PO TABS
5.0000 mg | ORAL_TABLET | Freq: Every day | ORAL | 0 refills | Status: DC
  Filled 2023-06-03: qty 30, 30d supply, fill #0

## 2023-06-15 ENCOUNTER — Other Ambulatory Visit (HOSPITAL_BASED_OUTPATIENT_CLINIC_OR_DEPARTMENT_OTHER): Payer: Self-pay

## 2023-06-15 ENCOUNTER — Other Ambulatory Visit: Payer: Self-pay

## 2023-06-15 DIAGNOSIS — D0439 Carcinoma in situ of skin of other parts of face: Secondary | ICD-10-CM | POA: Diagnosis not present

## 2023-06-15 DIAGNOSIS — L821 Other seborrheic keratosis: Secondary | ICD-10-CM | POA: Diagnosis not present

## 2023-06-15 DIAGNOSIS — D225 Melanocytic nevi of trunk: Secondary | ICD-10-CM | POA: Diagnosis not present

## 2023-06-15 DIAGNOSIS — D485 Neoplasm of uncertain behavior of skin: Secondary | ICD-10-CM | POA: Diagnosis not present

## 2023-06-15 DIAGNOSIS — L814 Other melanin hyperpigmentation: Secondary | ICD-10-CM | POA: Diagnosis not present

## 2023-06-20 ENCOUNTER — Other Ambulatory Visit (HOSPITAL_BASED_OUTPATIENT_CLINIC_OR_DEPARTMENT_OTHER): Payer: Self-pay

## 2023-06-20 DIAGNOSIS — G43019 Migraine without aura, intractable, without status migrainosus: Secondary | ICD-10-CM | POA: Diagnosis not present

## 2023-06-20 MED ORDER — LAMOTRIGINE 100 MG PO TABS
200.0000 mg | ORAL_TABLET | Freq: Every day | ORAL | 2 refills | Status: DC
  Filled 2023-08-23: qty 60, 30d supply, fill #0
  Filled 2024-02-17: qty 60, 30d supply, fill #1

## 2023-06-20 MED ORDER — EMGALITY 120 MG/ML ~~LOC~~ SOAJ
120.0000 mg | SUBCUTANEOUS | 2 refills | Status: DC
  Filled 2023-06-20 – 2023-07-08 (×2): qty 1, 30d supply, fill #0
  Filled 2023-08-23: qty 1, 30d supply, fill #1
  Filled 2023-12-28: qty 1, 30d supply, fill #2

## 2023-06-23 DIAGNOSIS — N390 Urinary tract infection, site not specified: Secondary | ICD-10-CM | POA: Diagnosis not present

## 2023-06-30 ENCOUNTER — Other Ambulatory Visit (HOSPITAL_BASED_OUTPATIENT_CLINIC_OR_DEPARTMENT_OTHER): Payer: Self-pay

## 2023-07-08 ENCOUNTER — Other Ambulatory Visit (HOSPITAL_BASED_OUTPATIENT_CLINIC_OR_DEPARTMENT_OTHER): Payer: Self-pay

## 2023-07-11 ENCOUNTER — Other Ambulatory Visit (HOSPITAL_BASED_OUTPATIENT_CLINIC_OR_DEPARTMENT_OTHER): Payer: Self-pay

## 2023-07-11 MED ORDER — SERTRALINE HCL 100 MG PO TABS
100.0000 mg | ORAL_TABLET | Freq: Every day | ORAL | 1 refills | Status: DC
  Filled 2023-07-11: qty 90, 90d supply, fill #0
  Filled 2023-11-04: qty 90, 90d supply, fill #1

## 2023-07-11 MED ORDER — LISINOPRIL-HYDROCHLOROTHIAZIDE 10-12.5 MG PO TABS
2.0000 | ORAL_TABLET | Freq: Every day | ORAL | 1 refills | Status: AC
  Filled 2023-07-11: qty 180, 90d supply, fill #0
  Filled 2023-12-15: qty 180, 90d supply, fill #1

## 2023-07-12 DIAGNOSIS — D0439 Carcinoma in situ of skin of other parts of face: Secondary | ICD-10-CM | POA: Diagnosis not present

## 2023-07-14 DIAGNOSIS — L820 Inflamed seborrheic keratosis: Secondary | ICD-10-CM | POA: Diagnosis not present

## 2023-07-14 DIAGNOSIS — L538 Other specified erythematous conditions: Secondary | ICD-10-CM | POA: Diagnosis not present

## 2023-07-14 DIAGNOSIS — Z48817 Encounter for surgical aftercare following surgery on the skin and subcutaneous tissue: Secondary | ICD-10-CM | POA: Diagnosis not present

## 2023-07-14 DIAGNOSIS — Z789 Other specified health status: Secondary | ICD-10-CM | POA: Diagnosis not present

## 2023-07-27 DIAGNOSIS — D0439 Carcinoma in situ of skin of other parts of face: Secondary | ICD-10-CM | POA: Diagnosis not present

## 2023-07-27 DIAGNOSIS — D485 Neoplasm of uncertain behavior of skin: Secondary | ICD-10-CM | POA: Diagnosis not present

## 2023-08-23 ENCOUNTER — Other Ambulatory Visit (HOSPITAL_BASED_OUTPATIENT_CLINIC_OR_DEPARTMENT_OTHER): Payer: Self-pay

## 2023-08-23 MED ORDER — PANTOPRAZOLE SODIUM 40 MG PO TBEC
40.0000 mg | DELAYED_RELEASE_TABLET | Freq: Every morning | ORAL | 0 refills | Status: DC
  Filled 2023-08-23: qty 90, 90d supply, fill #0

## 2023-08-24 ENCOUNTER — Other Ambulatory Visit (HOSPITAL_BASED_OUTPATIENT_CLINIC_OR_DEPARTMENT_OTHER): Payer: Self-pay

## 2023-08-25 ENCOUNTER — Other Ambulatory Visit (HOSPITAL_BASED_OUTPATIENT_CLINIC_OR_DEPARTMENT_OTHER): Payer: Self-pay

## 2023-08-31 ENCOUNTER — Other Ambulatory Visit (HOSPITAL_BASED_OUTPATIENT_CLINIC_OR_DEPARTMENT_OTHER): Payer: Self-pay

## 2023-08-31 DIAGNOSIS — G43719 Chronic migraine without aura, intractable, without status migrainosus: Secondary | ICD-10-CM | POA: Diagnosis not present

## 2023-08-31 MED ORDER — EMGALITY 120 MG/ML ~~LOC~~ SOAJ
120.0000 mg | SUBCUTANEOUS | 2 refills | Status: DC
  Filled 2023-08-31 – 2023-09-23 (×2): qty 1, 30d supply, fill #0
  Filled 2023-10-24: qty 1, 30d supply, fill #1
  Filled 2023-11-28: qty 1, 30d supply, fill #2

## 2023-08-31 MED ORDER — LAMOTRIGINE 100 MG PO TABS
200.0000 mg | ORAL_TABLET | Freq: Every day | ORAL | 2 refills | Status: DC
  Filled 2023-08-31 – 2023-11-04 (×2): qty 60, 30d supply, fill #0
  Filled 2023-11-29: qty 60, 30d supply, fill #1
  Filled 2024-01-13: qty 60, 30d supply, fill #2

## 2023-09-13 DIAGNOSIS — D7589 Other specified diseases of blood and blood-forming organs: Secondary | ICD-10-CM | POA: Diagnosis not present

## 2023-09-13 DIAGNOSIS — I10 Essential (primary) hypertension: Secondary | ICD-10-CM | POA: Diagnosis not present

## 2023-09-13 DIAGNOSIS — Z79899 Other long term (current) drug therapy: Secondary | ICD-10-CM | POA: Diagnosis not present

## 2023-09-13 DIAGNOSIS — Z720 Tobacco use: Secondary | ICD-10-CM | POA: Diagnosis not present

## 2023-09-13 DIAGNOSIS — Z6831 Body mass index (BMI) 31.0-31.9, adult: Secondary | ICD-10-CM | POA: Diagnosis not present

## 2023-09-13 DIAGNOSIS — E782 Mixed hyperlipidemia: Secondary | ICD-10-CM | POA: Diagnosis not present

## 2023-09-20 ENCOUNTER — Other Ambulatory Visit: Payer: Self-pay | Admitting: Family Medicine

## 2023-09-20 DIAGNOSIS — Z720 Tobacco use: Secondary | ICD-10-CM

## 2023-09-23 ENCOUNTER — Other Ambulatory Visit (HOSPITAL_BASED_OUTPATIENT_CLINIC_OR_DEPARTMENT_OTHER): Payer: Self-pay

## 2023-10-04 ENCOUNTER — Ambulatory Visit
Admission: RE | Admit: 2023-10-04 | Discharge: 2023-10-04 | Disposition: A | Discharge status: Home / Self Care | Source: Ambulatory Visit | Attending: Family Medicine | Admitting: Family Medicine

## 2023-10-04 DIAGNOSIS — Z720 Tobacco use: Secondary | ICD-10-CM

## 2023-10-04 DIAGNOSIS — F17210 Nicotine dependence, cigarettes, uncomplicated: Secondary | ICD-10-CM | POA: Diagnosis not present

## 2023-10-04 DIAGNOSIS — Z122 Encounter for screening for malignant neoplasm of respiratory organs: Secondary | ICD-10-CM | POA: Diagnosis not present

## 2023-10-07 ENCOUNTER — Other Ambulatory Visit (HOSPITAL_BASED_OUTPATIENT_CLINIC_OR_DEPARTMENT_OTHER): Payer: Self-pay

## 2023-10-10 ENCOUNTER — Other Ambulatory Visit (HOSPITAL_BASED_OUTPATIENT_CLINIC_OR_DEPARTMENT_OTHER): Payer: Self-pay

## 2023-10-10 MED ORDER — CARVEDILOL 25 MG PO TABS
25.0000 mg | ORAL_TABLET | Freq: Two times a day (BID) | ORAL | 0 refills | Status: DC
  Filled 2023-10-10: qty 180, 90d supply, fill #0

## 2023-10-24 ENCOUNTER — Other Ambulatory Visit (HOSPITAL_BASED_OUTPATIENT_CLINIC_OR_DEPARTMENT_OTHER): Payer: Self-pay

## 2023-11-04 ENCOUNTER — Other Ambulatory Visit (HOSPITAL_BASED_OUTPATIENT_CLINIC_OR_DEPARTMENT_OTHER): Payer: Self-pay

## 2023-11-10 DIAGNOSIS — Z01419 Encounter for gynecological examination (general) (routine) without abnormal findings: Secondary | ICD-10-CM | POA: Diagnosis not present

## 2023-11-10 DIAGNOSIS — Z1231 Encounter for screening mammogram for malignant neoplasm of breast: Secondary | ICD-10-CM | POA: Diagnosis not present

## 2023-11-28 ENCOUNTER — Other Ambulatory Visit (HOSPITAL_BASED_OUTPATIENT_CLINIC_OR_DEPARTMENT_OTHER): Payer: Self-pay

## 2023-11-29 ENCOUNTER — Other Ambulatory Visit (HOSPITAL_BASED_OUTPATIENT_CLINIC_OR_DEPARTMENT_OTHER): Payer: Self-pay

## 2023-12-15 ENCOUNTER — Other Ambulatory Visit (HOSPITAL_BASED_OUTPATIENT_CLINIC_OR_DEPARTMENT_OTHER): Payer: Self-pay

## 2023-12-16 ENCOUNTER — Other Ambulatory Visit (HOSPITAL_BASED_OUTPATIENT_CLINIC_OR_DEPARTMENT_OTHER): Payer: Self-pay

## 2023-12-16 MED ORDER — PANTOPRAZOLE SODIUM 40 MG PO TBEC
40.0000 mg | DELAYED_RELEASE_TABLET | Freq: Every morning | ORAL | 0 refills | Status: DC
  Filled 2023-12-16: qty 90, 90d supply, fill #0

## 2023-12-28 ENCOUNTER — Other Ambulatory Visit (HOSPITAL_BASED_OUTPATIENT_CLINIC_OR_DEPARTMENT_OTHER): Payer: Self-pay

## 2024-01-05 ENCOUNTER — Other Ambulatory Visit (HOSPITAL_BASED_OUTPATIENT_CLINIC_OR_DEPARTMENT_OTHER): Payer: Self-pay

## 2024-01-05 ENCOUNTER — Other Ambulatory Visit: Payer: Self-pay

## 2024-01-05 DIAGNOSIS — L821 Other seborrheic keratosis: Secondary | ICD-10-CM | POA: Diagnosis not present

## 2024-01-05 DIAGNOSIS — Z789 Other specified health status: Secondary | ICD-10-CM | POA: Diagnosis not present

## 2024-01-05 DIAGNOSIS — D485 Neoplasm of uncertain behavior of skin: Secondary | ICD-10-CM | POA: Diagnosis not present

## 2024-01-05 DIAGNOSIS — C44622 Squamous cell carcinoma of skin of right upper limb, including shoulder: Secondary | ICD-10-CM | POA: Diagnosis not present

## 2024-01-05 DIAGNOSIS — L820 Inflamed seborrheic keratosis: Secondary | ICD-10-CM | POA: Diagnosis not present

## 2024-01-05 DIAGNOSIS — Z08 Encounter for follow-up examination after completed treatment for malignant neoplasm: Secondary | ICD-10-CM | POA: Diagnosis not present

## 2024-01-05 DIAGNOSIS — L578 Other skin changes due to chronic exposure to nonionizing radiation: Secondary | ICD-10-CM | POA: Diagnosis not present

## 2024-01-05 DIAGNOSIS — L570 Actinic keratosis: Secondary | ICD-10-CM | POA: Diagnosis not present

## 2024-01-05 DIAGNOSIS — L814 Other melanin hyperpigmentation: Secondary | ICD-10-CM | POA: Diagnosis not present

## 2024-01-05 DIAGNOSIS — R208 Other disturbances of skin sensation: Secondary | ICD-10-CM | POA: Diagnosis not present

## 2024-01-05 MED ORDER — FLUOROURACIL 5 % EX CREA
TOPICAL_CREAM | Freq: Two times a day (BID) | CUTANEOUS | 1 refills | Status: DC
  Filled 2024-01-05: qty 40, 14d supply, fill #0

## 2024-01-13 ENCOUNTER — Other Ambulatory Visit (HOSPITAL_BASED_OUTPATIENT_CLINIC_OR_DEPARTMENT_OTHER): Payer: Self-pay

## 2024-01-31 ENCOUNTER — Other Ambulatory Visit (HOSPITAL_BASED_OUTPATIENT_CLINIC_OR_DEPARTMENT_OTHER): Payer: Self-pay

## 2024-01-31 MED ORDER — EMGALITY 120 MG/ML ~~LOC~~ SOAJ
120.0000 mg | SUBCUTANEOUS | 0 refills | Status: DC
  Filled 2024-01-31: qty 1, 30d supply, fill #0

## 2024-02-01 ENCOUNTER — Other Ambulatory Visit (HOSPITAL_BASED_OUTPATIENT_CLINIC_OR_DEPARTMENT_OTHER): Payer: Self-pay

## 2024-02-01 DIAGNOSIS — G43019 Migraine without aura, intractable, without status migrainosus: Secondary | ICD-10-CM | POA: Diagnosis not present

## 2024-02-01 MED ORDER — LAMOTRIGINE 100 MG PO TABS
200.0000 mg | ORAL_TABLET | Freq: Every day | ORAL | 5 refills | Status: AC
  Filled 2024-02-01 – 2024-03-26 (×2): qty 60, 30d supply, fill #0
  Filled 2024-05-01: qty 60, 30d supply, fill #1

## 2024-02-01 MED ORDER — EMGALITY 120 MG/ML ~~LOC~~ SOAJ
120.0000 mg | SUBCUTANEOUS | 5 refills | Status: AC
  Filled 2024-02-01 – 2024-02-27 (×3): qty 1, 30d supply, fill #0
  Filled 2024-04-03: qty 1, 30d supply, fill #1
  Filled 2024-05-01: qty 1, 30d supply, fill #2
  Filled 2024-06-01 (×2): qty 1, 30d supply, fill #3

## 2024-02-02 ENCOUNTER — Other Ambulatory Visit (HOSPITAL_BASED_OUTPATIENT_CLINIC_OR_DEPARTMENT_OTHER): Payer: Self-pay

## 2024-02-16 ENCOUNTER — Other Ambulatory Visit (HOSPITAL_COMMUNITY): Payer: Self-pay

## 2024-02-17 ENCOUNTER — Other Ambulatory Visit (HOSPITAL_BASED_OUTPATIENT_CLINIC_OR_DEPARTMENT_OTHER): Payer: Self-pay

## 2024-02-17 MED ORDER — CYCLOBENZAPRINE HCL 10 MG PO TABS
10.0000 mg | ORAL_TABLET | Freq: Three times a day (TID) | ORAL | 0 refills | Status: DC
  Filled 2024-02-17: qty 270, 90d supply, fill #0

## 2024-02-17 MED ORDER — SERTRALINE HCL 100 MG PO TABS
100.0000 mg | ORAL_TABLET | Freq: Every day | ORAL | 1 refills | Status: AC
  Filled 2024-02-17: qty 90, 90d supply, fill #0

## 2024-02-17 MED ORDER — ATORVASTATIN CALCIUM 40 MG PO TABS
40.0000 mg | ORAL_TABLET | Freq: Every day | ORAL | 3 refills | Status: AC
  Filled 2024-02-17: qty 90, 90d supply, fill #0

## 2024-02-17 MED ORDER — PROMETHAZINE HCL 25 MG PO TABS
25.0000 mg | ORAL_TABLET | Freq: Three times a day (TID) | ORAL | 2 refills | Status: AC | PRN
Start: 2024-02-17 — End: ?
  Filled 2024-02-17: qty 30, 10d supply, fill #0

## 2024-02-21 ENCOUNTER — Other Ambulatory Visit (HOSPITAL_BASED_OUTPATIENT_CLINIC_OR_DEPARTMENT_OTHER): Payer: Self-pay

## 2024-02-27 ENCOUNTER — Other Ambulatory Visit (HOSPITAL_BASED_OUTPATIENT_CLINIC_OR_DEPARTMENT_OTHER): Payer: Self-pay

## 2024-02-28 DIAGNOSIS — L905 Scar conditions and fibrosis of skin: Secondary | ICD-10-CM | POA: Diagnosis not present

## 2024-02-28 DIAGNOSIS — C44622 Squamous cell carcinoma of skin of right upper limb, including shoulder: Secondary | ICD-10-CM | POA: Diagnosis not present

## 2024-03-20 ENCOUNTER — Other Ambulatory Visit (HOSPITAL_BASED_OUTPATIENT_CLINIC_OR_DEPARTMENT_OTHER): Payer: Self-pay

## 2024-03-21 ENCOUNTER — Other Ambulatory Visit (HOSPITAL_BASED_OUTPATIENT_CLINIC_OR_DEPARTMENT_OTHER): Payer: Self-pay

## 2024-03-21 MED ORDER — CARVEDILOL 25 MG PO TABS
25.0000 mg | ORAL_TABLET | Freq: Two times a day (BID) | ORAL | 0 refills | Status: DC
  Filled 2024-03-21: qty 60, 30d supply, fill #0

## 2024-03-23 ENCOUNTER — Other Ambulatory Visit (HOSPITAL_BASED_OUTPATIENT_CLINIC_OR_DEPARTMENT_OTHER): Payer: Self-pay

## 2024-03-26 ENCOUNTER — Other Ambulatory Visit (HOSPITAL_BASED_OUTPATIENT_CLINIC_OR_DEPARTMENT_OTHER): Payer: Self-pay

## 2024-03-26 MED ORDER — PANTOPRAZOLE SODIUM 40 MG PO TBEC
40.0000 mg | DELAYED_RELEASE_TABLET | Freq: Every morning | ORAL | 0 refills | Status: DC
  Filled 2024-03-26: qty 90, 90d supply, fill #0

## 2024-04-03 ENCOUNTER — Other Ambulatory Visit (HOSPITAL_BASED_OUTPATIENT_CLINIC_OR_DEPARTMENT_OTHER): Payer: Self-pay

## 2024-05-01 ENCOUNTER — Other Ambulatory Visit (HOSPITAL_BASED_OUTPATIENT_CLINIC_OR_DEPARTMENT_OTHER): Payer: Self-pay

## 2024-05-10 ENCOUNTER — Other Ambulatory Visit (HOSPITAL_BASED_OUTPATIENT_CLINIC_OR_DEPARTMENT_OTHER): Payer: Self-pay

## 2024-05-10 MED ORDER — HYDROCODONE-ACETAMINOPHEN 5-325 MG PO TABS
1.0000 | ORAL_TABLET | ORAL | 0 refills | Status: DC | PRN
  Filled 2024-05-10: qty 12, 2d supply, fill #0

## 2024-05-18 ENCOUNTER — Other Ambulatory Visit (HOSPITAL_BASED_OUTPATIENT_CLINIC_OR_DEPARTMENT_OTHER): Payer: Self-pay

## 2024-05-18 MED ORDER — CARVEDILOL 25 MG PO TABS
25.0000 mg | ORAL_TABLET | Freq: Two times a day (BID) | ORAL | 0 refills | Status: AC
  Filled 2024-05-18: qty 180, 90d supply, fill #0

## 2024-05-21 ENCOUNTER — Emergency Department (HOSPITAL_BASED_OUTPATIENT_CLINIC_OR_DEPARTMENT_OTHER)

## 2024-05-21 ENCOUNTER — Encounter (HOSPITAL_BASED_OUTPATIENT_CLINIC_OR_DEPARTMENT_OTHER): Payer: Self-pay

## 2024-05-21 ENCOUNTER — Other Ambulatory Visit: Payer: Self-pay

## 2024-05-21 ENCOUNTER — Other Ambulatory Visit (HOSPITAL_BASED_OUTPATIENT_CLINIC_OR_DEPARTMENT_OTHER): Payer: Self-pay

## 2024-05-21 ENCOUNTER — Inpatient Hospital Stay (HOSPITAL_BASED_OUTPATIENT_CLINIC_OR_DEPARTMENT_OTHER)
Admission: EM | Admit: 2024-05-21 | Discharge: 2024-05-23 | DRG: 872 | Disposition: A | Discharge status: Home / Self Care | Attending: Family Medicine | Admitting: Family Medicine

## 2024-05-21 DIAGNOSIS — A419 Sepsis, unspecified organism: Secondary | ICD-10-CM | POA: Diagnosis not present

## 2024-05-21 DIAGNOSIS — Z79899 Other long term (current) drug therapy: Secondary | ICD-10-CM

## 2024-05-21 DIAGNOSIS — E785 Hyperlipidemia, unspecified: Secondary | ICD-10-CM | POA: Diagnosis present

## 2024-05-21 DIAGNOSIS — F32A Depression, unspecified: Secondary | ICD-10-CM | POA: Diagnosis present

## 2024-05-21 DIAGNOSIS — I10 Essential (primary) hypertension: Secondary | ICD-10-CM | POA: Diagnosis present

## 2024-05-21 DIAGNOSIS — K219 Gastro-esophageal reflux disease without esophagitis: Secondary | ICD-10-CM | POA: Diagnosis present

## 2024-05-21 DIAGNOSIS — Z885 Allergy status to narcotic agent status: Secondary | ICD-10-CM

## 2024-05-21 DIAGNOSIS — F419 Anxiety disorder, unspecified: Secondary | ICD-10-CM | POA: Diagnosis present

## 2024-05-21 DIAGNOSIS — E8809 Other disorders of plasma-protein metabolism, not elsewhere classified: Secondary | ICD-10-CM | POA: Diagnosis present

## 2024-05-21 DIAGNOSIS — Z888 Allergy status to other drugs, medicaments and biological substances status: Secondary | ICD-10-CM

## 2024-05-21 DIAGNOSIS — Z8249 Family history of ischemic heart disease and other diseases of the circulatory system: Secondary | ICD-10-CM

## 2024-05-21 DIAGNOSIS — N3001 Acute cystitis with hematuria: Secondary | ICD-10-CM | POA: Diagnosis present

## 2024-05-21 DIAGNOSIS — Z6833 Body mass index (BMI) 33.0-33.9, adult: Secondary | ICD-10-CM

## 2024-05-21 DIAGNOSIS — E66811 Obesity, class 1: Secondary | ICD-10-CM | POA: Diagnosis present

## 2024-05-21 DIAGNOSIS — J439 Emphysema, unspecified: Secondary | ICD-10-CM | POA: Diagnosis present

## 2024-05-21 DIAGNOSIS — Z7982 Long term (current) use of aspirin: Secondary | ICD-10-CM

## 2024-05-21 DIAGNOSIS — N29 Other disorders of kidney and ureter in diseases classified elsewhere: Secondary | ICD-10-CM | POA: Diagnosis present

## 2024-05-21 DIAGNOSIS — D696 Thrombocytopenia, unspecified: Secondary | ICD-10-CM | POA: Diagnosis present

## 2024-05-21 DIAGNOSIS — Z1611 Resistance to penicillins: Secondary | ICD-10-CM | POA: Diagnosis present

## 2024-05-21 DIAGNOSIS — E876 Hypokalemia: Secondary | ICD-10-CM | POA: Diagnosis present

## 2024-05-21 DIAGNOSIS — F17210 Nicotine dependence, cigarettes, uncomplicated: Secondary | ICD-10-CM | POA: Diagnosis present

## 2024-05-21 DIAGNOSIS — N12 Tubulo-interstitial nephritis, not specified as acute or chronic: Principal | ICD-10-CM | POA: Diagnosis present

## 2024-05-21 DIAGNOSIS — R000 Tachycardia, unspecified: Secondary | ICD-10-CM

## 2024-05-21 DIAGNOSIS — B9620 Unspecified Escherichia coli [E. coli] as the cause of diseases classified elsewhere: Secondary | ICD-10-CM | POA: Diagnosis present

## 2024-05-21 DIAGNOSIS — Z7902 Long term (current) use of antithrombotics/antiplatelets: Secondary | ICD-10-CM

## 2024-05-21 DIAGNOSIS — E872 Acidosis, unspecified: Secondary | ICD-10-CM | POA: Diagnosis present

## 2024-05-21 LAB — COMPREHENSIVE METABOLIC PANEL WITH GFR
ALT: 13 U/L (ref 0–44)
AST: 21 U/L (ref 15–41)
Albumin: 4.3 g/dL (ref 3.5–5.0)
Alkaline Phosphatase: 81 U/L (ref 38–126)
Anion gap: 16 — ABNORMAL HIGH (ref 5–15)
BUN: 11 mg/dL (ref 6–20)
CO2: 20 mmol/L — ABNORMAL LOW (ref 22–32)
Calcium: 9.5 mg/dL (ref 8.9–10.3)
Chloride: 101 mmol/L (ref 98–111)
Creatinine, Ser: 0.99 mg/dL (ref 0.44–1.00)
GFR, Estimated: >60 mL/min
Glucose, Bld: 98 mg/dL (ref 70–99)
Potassium: 3.5 mmol/L (ref 3.5–5.1)
Sodium: 136 mmol/L (ref 135–145)
Total Bilirubin: 0.6 mg/dL (ref 0.0–1.2)
Total Protein: 8 g/dL (ref 6.5–8.1)

## 2024-05-21 LAB — CBC WITH DIFFERENTIAL/PLATELET
Abs Immature Granulocytes: 0.04 10*3/uL (ref 0.00–0.07)
Basophils Absolute: 0 10*3/uL (ref 0.0–0.1)
Basophils Relative: 0 %
Eosinophils Absolute: 0 10*3/uL (ref 0.0–0.5)
Eosinophils Relative: 0 %
HCT: 43.7 % (ref 36.0–46.0)
Hemoglobin: 15.5 g/dL — ABNORMAL HIGH (ref 12.0–15.0)
Immature Granulocytes: 0 %
Lymphocytes Relative: 13 %
Lymphs Abs: 1.3 10*3/uL (ref 0.7–4.0)
MCH: 34.4 pg — ABNORMAL HIGH (ref 26.0–34.0)
MCHC: 35.5 g/dL (ref 30.0–36.0)
MCV: 96.9 fL (ref 80.0–100.0)
Monocytes Absolute: 0.7 10*3/uL (ref 0.1–1.0)
Monocytes Relative: 7 %
Neutro Abs: 8 10*3/uL — ABNORMAL HIGH (ref 1.7–7.7)
Neutrophils Relative %: 80 %
Platelets: 163 10*3/uL (ref 150–400)
RBC: 4.51 MIL/uL (ref 3.87–5.11)
RDW: 12.7 % (ref 11.5–15.5)
WBC: 10.1 10*3/uL (ref 4.0–10.5)
nRBC: 0 % (ref 0.0–0.2)

## 2024-05-21 LAB — LACTIC ACID, PLASMA: Lactic Acid, Venous: 1.9 mmol/L (ref 0.5–1.9)

## 2024-05-21 MED ORDER — SODIUM CHLORIDE 0.9 % IV BOLUS
1000.0000 mL | Freq: Once | INTRAVENOUS | Status: AC
  Administered 2024-05-21: 1000 mL via INTRAVENOUS

## 2024-05-21 MED ORDER — PROCHLORPERAZINE EDISYLATE 10 MG/2ML IJ SOLN
5.0000 mg | Freq: Four times a day (QID) | INTRAMUSCULAR | Status: DC | PRN
  Administered 2024-05-22: 5 mg via INTRAVENOUS
  Filled 2024-05-21: qty 2

## 2024-05-21 MED ORDER — MELATONIN 5 MG PO TABS
5.0000 mg | ORAL_TABLET | Freq: Every evening | ORAL | Status: DC | PRN
  Filled 2024-05-21: qty 1

## 2024-05-21 MED ORDER — MORPHINE SULFATE (PF) 2 MG/ML IV SOLN: 2.0000 mg | INTRAVENOUS | Status: DC | PRN

## 2024-05-21 MED ORDER — ACETAMINOPHEN 500 MG PO TABS
1000.0000 mg | ORAL_TABLET | Freq: Once | ORAL | Status: AC
  Administered 2024-05-21: 1000 mg via ORAL
  Filled 2024-05-21: qty 2

## 2024-05-21 MED ORDER — IOHEXOL 300 MG/ML  SOLN
100.0000 mL | Freq: Once | INTRAMUSCULAR | Status: AC | PRN
  Administered 2024-05-21: 100 mL via INTRAVENOUS

## 2024-05-21 MED ORDER — SODIUM CHLORIDE 0.9 % IV SOLN: 2.0000 g | INTRAVENOUS | Status: DC

## 2024-05-21 MED ORDER — HYDROMORPHONE HCL 1 MG/ML IJ SOLN
0.5000 mg | Freq: Once | INTRAMUSCULAR | Status: AC
  Administered 2024-05-21: 0.5 mg via INTRAVENOUS
  Filled 2024-05-21: qty 1

## 2024-05-21 MED ORDER — CARVEDILOL 3.125 MG PO TABS
3.1250 mg | ORAL_TABLET | Freq: Two times a day (BID) | ORAL | Status: DC
  Administered 2024-05-22 – 2024-05-23 (×4): 3.125 mg via ORAL
  Filled 2024-05-21 (×4): qty 1

## 2024-05-21 MED ORDER — KETOROLAC TROMETHAMINE 15 MG/ML IJ SOLN
15.0000 mg | Freq: Once | INTRAMUSCULAR | Status: AC
  Administered 2024-05-21: 15 mg via INTRAVENOUS
  Filled 2024-05-21: qty 1

## 2024-05-21 MED ORDER — ONDANSETRON HCL 4 MG/2ML IJ SOLN
4.0000 mg | Freq: Once | INTRAMUSCULAR | Status: AC
  Administered 2024-05-21: 4 mg via INTRAVENOUS
  Filled 2024-05-21: qty 2

## 2024-05-21 MED ORDER — SODIUM CHLORIDE 0.9 % IV SOLN
1.0000 g | Freq: Once | INTRAVENOUS | Status: AC
  Administered 2024-05-21: 1 g via INTRAVENOUS
  Filled 2024-05-21: qty 10

## 2024-05-21 MED ORDER — ENOXAPARIN SODIUM 40 MG/0.4ML IJ SOSY
40.0000 mg | PREFILLED_SYRINGE | INTRAMUSCULAR | Status: DC
  Administered 2024-05-22 – 2024-05-23 (×2): 40 mg via SUBCUTANEOUS
  Filled 2024-05-21 (×2): qty 0.4

## 2024-05-21 MED ORDER — POLYETHYLENE GLYCOL 3350 17 G PO PACK: 17.0000 g | PACK | Freq: Every day | ORAL | Status: DC | PRN

## 2024-05-21 NOTE — H&P (Signed)
 " History and Physical  Heather Gomez FMW:[id number] DOB: 12/20/1965 DOA: 05/21/2024  Referring physician: Accepted by Dr. Franky [facility], hospitalist service. PCP: Frederik Charleston, MD  Outpatient Specialists: Urology. Patient coming from: Home.  Chief Complaint: Flank pain.  HPI: Heather Gomez is a 59 y.o. female with medical history significant for renal calculi, essential hypertension, hyperlipidemia, chronic anxiety/depression, current tobacco use, emphysema, pulmonary nodules, GERD, migraines, who presents to [facility] ER with complaints of bilateral flank pain and increase in urinary urgency for the past 3 days.  Denies dysuria or hematuria.  Associated with subjective fevers and chills at home.  She presented to the ER for further evaluation.  In the ER, febrile with Tmax 101.2, tachycardic 136, tachypneic 24, UA positive for pyuria.  CT abdomen and pelvis with contrast revealed diffuse right ureteral wall enhancement which may represent urinary tract infection without obstructing stone.  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  Diverticulosis without diverticulitis.  The patient received Rocephin  1 g x 1 in the ER and opiate-based IV analgesics.  TR[facility], hospitalist service, was asked to admit.  Accepted by Dr. Franky and transferred to [facility] progressive care unit as observation status.  ED Course: Temperature 98.2.  BP 107/77, pulse 94, respiration rate 18, O2 saturation 96% on room air.  Review of Systems: Review of systems as noted in the HPI. All other systems reviewed and are negative.   Past Medical History:  Diagnosis Date   Depression    Hypertension    Kidney stones    Migraines    Past Surgical History:  Procedure Laterality Date   LITHOTRIPSY     URETERAL STENT PLACEMENT     WISDOM TOOTH EXTRACTION      Social History:  reports that she has been smoking cigarettes. She has never used smokeless tobacco. She reports  that she does not drink alcohol and does not use drugs.   Allergies[1]  Family History  Problem Relation Age of Onset   Hypertension Mother    Hypertension Father       Prior to Admission medications  Medication Sig Start Date End Date Taking? Authorizing Provider  amLODipine  (NORVASC ) 5 MG tablet Take 1 tablet (5 mg total) by mouth daily. 06/03/23     amoxicillin  (AMOXIL ) 500 MG capsule Take 1 capsule by mouth 2 times daily for 5 days 02/02/21     amoxicillin  (AMOXIL ) 500 MG capsule Take 1 capsule (500 mg total) by mouth 3 (three) times daily for 10 days 02/15/23     aspirin  EC 81 MG EC tablet Take 1 tablet (81 mg total) by mouth daily. 02/17/19   Vann, Jessica U, DO  atorvastatin  (LIPITOR) 40 MG tablet Take 1 tablet (40 mg total) by mouth daily at 6 PM. 02/16/19   Juvenal Harlene PENNER, DO  atorvastatin  (LIPITOR) 40 MG tablet TAKE ONE TABLET BY MOUTH EVERY EVENING AT [facility] 06/23/20 06/23/21  Frederik Charleston, MD  atorvastatin  (LIPITOR) 40 MG tablet TAKE ONE TABLET BY MOUTH EVERY EVENING AT [facility] 04/07/20 04/07/21  Frederik Charleston, MD  atorvastatin  (LIPITOR) 40 MG tablet TAKE 1 TABLET BY MOUTH EVERY EVENING AT [facility] 05/09/20 05/09/21  Frederik Charleston, MD  atorvastatin  (LIPITOR) 40 MG tablet TAKE ONE TABLET BY MOUTH EVERY EVENING AT 6PM 08/05/20     atorvastatin  (LIPITOR) 40 MG tablet Take 1 tablet (40 mg total) by mouth every evening at 6 pm. 08/02/22     atorvastatin  (LIPITOR) 40 MG tablet Take 1 tablet (40 mg total) by mouth  daily. 02/17/24     carvedilol  (COREG ) 12.5 MG tablet TAKE ONE TABLET BY MOUTH TWICE DAILY WITH FOOD 02/12/20 02/11/21  Frederik Charleston, MD  carvedilol  (COREG ) 12.5 MG tablet TAKE 1 TABLET BY MOUTH TWO TIMES DAILY WITH FOOD 02/11/20 02/10/21  Frederik Charleston, MD  carvedilol  (COREG ) 12.5 MG tablet Take 1 tablet (12.5 mg total) by mouth 2 (two) times daily with food 06/23/22     carvedilol  (COREG ) 25 MG tablet Take 1 tablet (25 mg total) by mouth 2 (two) times daily with food. 10/10/23      carvedilol  (COREG ) 25 MG tablet Take 1 tablet (25 mg total) by mouth 2 (two) times daily after a meal. 05/18/24     carvedilol  (COREG ) 6.25 MG tablet Take 1 tablet (6.25 mg total) by mouth 2 (two) times daily with a meal. 02/16/19   Vann, Jessica U, DO  cephALEXin  (KEFLEX ) 500 MG capsule Take 1 capsule (500 mg total) by mouth 3 (three) times daily. 06/20/22     ciprofloxacin  (CIPRO ) 250 MG tablet Take 1 tablet (250 mg total) by mouth every 12 (twelve) hours for 5 days 06/23/22     ciprofloxacin  (CIPRO ) 250 MG tablet Take 1 tablet (250 mg total) by mouth every 12 (twelve) hours for 5 days. 06/03/23     clopidogrel  (PLAVIX ) 75 MG tablet Take 1 tablet (75 mg total) by mouth daily. 02/16/19   Vann, Jessica U, DO  cyclobenzaprine  (FLEXERIL ) 10 MG tablet Take 10 mg by mouth 3 (three) times daily as needed for muscle spasms.     [provider]  cyclobenzaprine  (FLEXERIL ) 10 MG tablet take 1 tablet by mouth 3 times a day as needed 05/06/21     cyclobenzaprine  (FLEXERIL ) 10 MG tablet Take 1 tablet (10 mg total) by mouth in the morning, at noon, and at bedtime. 02/17/24     fluorouracil  (EFUDEX ) 5 % cream Apply topically to affected areas on nose 2 (two) times daily for 1-2 weeks. 01/05/24     gabapentin  (NEURONTIN ) 300 MG capsule take 1 capsule by mouth once a day on day one, then twice a day on day 2; may increase to 3 times a day if needed for pain 30 day(s) 10/01/20     Galcanezumab -gnlm (EMGALITY ) 120 MG/ML SOAJ Inject 120 mg into the skin every 30 (thirty) days. 06/20/23     Galcanezumab -gnlm (EMGALITY ) 120 MG/ML SOAJ Inject 120 mg into the skin every 30 (thirty) days. 01/31/24     Galcanezumab -gnlm (EMGALITY ) 120 MG/ML SOAJ Inject 120 mg into the skin every 30 (thirty) days for migraine prevention. 02/01/24     HYDROcodone -acetaminophen  (NORCO/VICODIN) 5-325 MG tablet Take one tablet by mouth every 4-6 hours as needed for pain. 09/10/20     HYDROcodone -acetaminophen  (NORCO/VICODIN) 5-325 MG tablet Take 1  tablet by mouth every 8 (eight) hours as needed. *Max daily dose 3 tablets* 06/20/22     HYDROcodone -acetaminophen  (NORCO/VICODIN) 5-325 MG tablet Take 1 tablet by mouth every 4-6 hours as needed for pain. 05/10/24     ibuprofen  (ADVIL ) 600 MG tablet Take 1 tablet (600 mg total) by mouth every 6 (six) hours as needed for pain or fever 06/20/22     ketorolac  (TORADOL ) 10 MG tablet Take 1 tablet (10 mg total) by mouth every 6 (six) hours as needed for pain 06/25/22     lamoTRIgine  (LAMICTAL ) 100 MG tablet Take 100 mg by mouth 2 (two) times daily.    [provider]  lamoTRIgine  (LAMICTAL ) 100 MG tablet TAKE 4  TABLETS BY MOUTH DAILY **LAST REFILL UNTIL OFFICE VISIT** 05/06/20 05/06/21  Oneita Na, MD  lamoTRIgine  (LAMICTAL ) 100 MG tablet TAKE 4 TABLETS BY MOUTH DAILY FOR 30 DAYS 03/24/20 03/24/21  Oneita Na, MD  lamoTRIgine  (LAMICTAL ) 100 MG tablet TAKE 4 TABLETS BY MOUTH EVERYDAY 08/21/19 08/20/20  Oneita Na, MD  lamoTRIgine  (LAMICTAL ) 100 MG tablet TAKE 4 TABLETS BY MOUTH DAILY 06/19/20 06/19/21  Oneita Na, MD  lamoTRIgine  (LAMICTAL ) 100 MG tablet Take 4 tablets (400 mg total) by mouth daily. 02/22/22     lamoTRIgine  (LAMICTAL ) 100 MG tablet Take 2 tablets (200 mg total) by mouth daily. 04/26/23     lamoTRIgine  (LAMICTAL ) 100 MG tablet Take 2 tablets (200 mg total) by mouth daily. 06/20/23     lamoTRIgine  (LAMICTAL ) 100 MG tablet Take 2 tablets (200 mg total) by mouth daily. 08/31/23     lamoTRIgine  (LAMICTAL ) 100 MG tablet Take 2 tablets (200 mg total) by mouth daily. 02/01/24     lidocaine  (LIDODERM ) 5 % Place one patch onto the skin daily for 30 days. Remove & Discard patch within 12 hours or as directed by MD 04/19/22     lisinopril -hydrochlorothiazide  (ZESTORETIC ) 10-12.5 MG tablet TAKE 1 TABLET BY MOUTH ONCE DAILY 06/23/20 06/23/21  Frederik Charleston, MD  lisinopril -hydrochlorothiazide  (ZESTORETIC ) 10-12.5 MG tablet TAKE ONE TABLET BY MOUTH ONCE DAILY 04/07/20 04/07/21   Frederik Charleston, MD  lisinopril -hydrochlorothiazide  (ZESTORETIC ) 10-12.5 MG tablet TAKE 1 TABLET BY MOUTH ONCE DAILY 01/08/20 01/07/21  Frederik Charleston, MD  lisinopril -hydrochlorothiazide  (ZESTORETIC ) 10-12.5 MG tablet Take 1 tablet by mouth daily. 08/21/21     lisinopril -hydrochlorothiazide  (ZESTORETIC ) 10-12.5 MG tablet Take 2 tablets by mouth daily. 07/11/23     meloxicam  (MOBIC ) 15 MG tablet Take 1 tablet by mouth once a day 10/10/20     methylPREDNISolone  (MEDROL  DOSEPAK) 4 MG TBPK tablet Take as directed by mouth per package instructions 02/15/23     Multiple Vitamin (MULTIVITAMIN WITH MINERALS) TABS tablet Take 1 tablet by mouth daily.    [provider]  nitrofurantoin , macrocrystal-monohydrate, (MACROBID ) 100 MG capsule Take 1 capsule (100 mg total) by mouth 2 (two) times daily for 7 days 04/28/23     ondansetron  (ZOFRAN -ODT) 4 MG disintegrating tablet Take 1 tablet (4 mg total) by mouth every 8 (eight) hours as needed for nausea (and vomiting) 06/20/22     oxyCODONE -acetaminophen  (PERCOCET) 5-325 MG tablet Take 1 (one) Tablet by mouth two times daily, as needed 10/09/20     pantoprazole  (PROTONIX ) 40 MG tablet Take 1 tablet by mouth daily 06/25/21     pantoprazole  (PROTONIX ) 40 MG tablet Take 1 tablet (40 mg total) by mouth every morning. 05/25/22     pantoprazole  (PROTONIX ) 40 MG tablet Take 1 tablet (40 mg total) by mouth every morning. 05/25/22     pantoprazole  (PROTONIX ) 40 MG tablet Take 1 tablet (40 mg total) by mouth every morning. 03/26/24     promethazine  (PHENERGAN ) 25 MG tablet Take 25 mg by mouth every 6 (six) hours as needed for nausea or vomiting.     [provider]  promethazine  (PHENERGAN ) 25 MG tablet Take 1 tablet (25 mg total) by mouth every 6 (six) hours as needed. 04/30/22     promethazine  (PHENERGAN ) 25 MG tablet Take 1 tablet (25 mg total) by mouth every 8 (eight) hours as needed for nausea 02/17/24     sertraline  (ZOLOFT ) 100 MG tablet Take 100 mg by mouth at  bedtime.     [provider]  sertraline  (ZOLOFT )  100 MG tablet TAKE ONE TABLET BY MOUTH ONCE DAILY 07/14/20 07/14/21  Frederik Charleston, MD  sertraline  (ZOLOFT ) 100 MG tablet TAKE ONE TABLET BY MOUTH ONCE DAILY 02/12/20 02/11/21  Frederik Charleston, MD  sertraline  (ZOLOFT ) 100 MG tablet TAKE ONE TABLET BY MOUTH ONCE DAILY 90 days 08/05/20     sertraline  (ZOLOFT ) 100 MG tablet Take 1 tablet (100 mg total) by mouth daily. 11/19/22     sertraline  (ZOLOFT ) 100 MG tablet Take 1 tablet (100 mg total) by mouth daily. 02/17/24     silver  sulfADIAZINE  (SILVADENE ) 1 % cream Apply 1 application on to the skin 2 times daily for 7 days 10/09/20     Sodium Sulfate-Mag Sulfate-KCl (SUTAB ) [phone] MG TABS Use As directed on package 05/25/22     tamsulosin  (FLOMAX ) 0.4 MG CAPS capsule Take 1 capsule (0.4 mg total) by mouth daily. 06/25/22       Physical Exam: BP (!) 136/112 (BP Location: Right Arm)   Pulse (!) 102   Temp 98.2 F (36.8 C)   Resp 18   Ht 5' 5 (1.651 m)   Wt 91 kg   LMP  (LMP Unknown)   SpO2 96%   BMI 33.38 kg/m   General: 59 y.o. year-old female well developed well nourished in no acute distress.  Somnolent, easily arouses, and answers questions appropriately. Cardiovascular: Regular rate and rhythm with no rubs or gallops.  No thyromegaly or JVD noted.  No lower extremity edema. 2/4 pulses in all 4 extremities. Respiratory: Clear to auscultation with no wheezes or rales. Good inspiratory effort. Abdomen: Soft nontender nondistended with normal bowel sounds x4 quadrants.  Muskuloskeletal: No cyanosis, clubbing or edema noted bilaterally Neuro: CN II-XII intact, strength, sensation, reflexes Skin: No ulcerative lesions noted or rashes Psychiatry: Judgement and insight appear normal. Mood is appropriate for condition and setting          Labs on Admission:  Basic Metabolic Panel: Recent Labs  Lab 05/21/24 1530  NA 136  K 3.5  CL 101  CO2 20*  GLUCOSE 98  BUN 11   CREATININE 0.99  CALCIUM  9.5   Liver Function Tests: Recent Labs  Lab 05/21/24 1530  AST 21  ALT 13  ALKPHOS 81  BILITOT 0.6  PROT 8.0  ALBUMIN 4.3   No results for input(s): LIPASE, AMYLASE in the last 168 hours. No results for input(s): AMMONIA in the last 168 hours. CBC: Recent Labs  Lab 05/21/24 1530  WBC 10.1  NEUTROABS 8.0*  HGB 15.5*  HCT 43.7  MCV 96.9  PLT 163   Cardiac Enzymes: No results for input(s): CKTOTAL, CKMB, CKMBINDEX, TROPONINI in the last 168 hours.  BNP (last 3 results) No results for input(s): BNP in the last 8760 hours.  ProBNP (last 3 results) No results for input(s): PROBNP in the last 8760 hours.  CBG: No results for input(s): GLUCAP in the last 168 hours.  Radiological Exams on Admission: CT ABDOMEN PELVIS W CONTRAST Result Date: 05/21/2024 EXAM: CT ABDOMEN AND PELVIS WITH CONTRAST 05/21/2024 05:05:34 PM TECHNIQUE: CT of the abdomen and pelvis was performed with the administration of 100 mL of iohexol  (OMNIPAQUE ) 300 MG/ML solution. Multiplanar reformatted images are provided for review. Automated exposure control, iterative reconstruction, and/or weight-based adjustment of the mA/kV was utilized to reduce the radiation dose to as low as reasonably achievable. COMPARISON: 07/16/2018 CLINICAL HISTORY: Bilateral flank pain, febrile, history of kidney stones, generalized abdominal pain. FINDINGS: LOWER CHEST: No acute abnormality. LIVER: The liver is unremarkable.  GALLBLADDER AND BILE DUCTS: The gallbladder has been surgically removed. No biliary ductal dilatation. SPLEEN: No acute abnormality. PANCREAS: No acute abnormality. ADRENAL GLANDS: No acute abnormality. 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 Bilateral simple renal cysts are noted. Per consensus, no follow-up is needed for simple Bosniak type 1 and 2 renal cysts, unless the patient has a malignancy history or risk factors. The right  ureter demonstrates diffuse wall enhancement, which may represent an underlying UTI. No obstructing stone is seen. No hydronephrosis. No perinephric or periureteral stranding. The bladder is within normal limits. GI AND BOWEL: Scattered diverticular changes of the colon are noted without evidence of diverticulitis. The appendix is within normal limits. Small bowel and stomach are unremarkable. There is no bowel obstruction. PERITONEUM AND RETROPERITONEUM: No ascites. No free air. VASCULATURE: Aortic calcifications are seen without aneurysmal dilatation. LYMPH NODES: No lymphadenopathy. REPRODUCTIVE ORGANS: The uterus is within normal limits. No adnexal mass is seen. BONES AND SOFT TISSUES: No acute osseous abnormality. No focal soft tissue abnormality. IMPRESSION: 1. Diffuse right ureteral wall enhancement, which may represent urinary tract infection, without obstructing stone. 2. 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 3. Diverticulosis without diverticulitis. Electronically signed by: Oneil Devonshire MD 05/21/2024 05:20 PM EST RP Workstation: [id number]   DG Chest 2 View Result Date: 05/21/2024 EXAM: 2 VIEW(S) XRAY OF THE CHEST 05/21/2024 03:37:00 PM COMPARISON: Chest CT 10/04/2023 and chest radiograph 02/15/2019. CLINICAL HISTORY: Infection. ICD10: [id number] Toxic effect of unspecified substance, accidental (unintentional), initial encounter. FINDINGS: LUNGS AND PLEURA: Lungs are clear. No pleural effusion. No pneumothorax. HEART AND MEDIASTINUM: Heart size and pulmonary vascularity are normal. Mediastinal contours appear intact. Tortuous aorta. BONES AND SOFT TISSUES: No acute osseous abnormality. IMPRESSION: 1. No acute cardiopulmonary abnormality. Electronically signed by: Elsie Gravely MD 05/21/2024 03:43 PM EST RP Workstation: [id number]    EKG: I independently viewed the EKG done and my findings are as followed: None available at the time of this visit.  Assessment/Plan Present on  Admission:  Sepsis (HCC)  Principal Problem:   Sepsis (HCC)  Sepsis secondary to right pyelonephritis, POA. Febrile Tmax 101.2, tachycardic 136, tachypneic 24 Urine positive for pyuria.  Follow urine culture for ID and sensitivities. Lactic acid 1.9, reassuring Follow peripheral blood cultures x 2. Continue Rocephin  2 g for now De-escalate antibiotics when able, based upon culture results. Continue IV fluid hydration LR at 75 cc/h x 1 day. Monitor fever curve and WBCs Maintain MAP greater than 65.  High anion gap metabolic acidosis Serum bicarb 20, anion gap 16 Continue LR IV fluid hydration Repeat BMP in the morning.  Current tobacco user Recommend complete cessation of tobacco use Smokes 1 pack/day  Hypertension BPs are currently soft, 107/77 Closely monitor vital signs in the setting of sepsis.  Hyperlipidemia Resume home atorvastatin .  GERD Resume home PPI.  Chronic anxiety/depression Resume home Zoloft .  Emphysema/pulmonary nodules Recommend close follow-up outpatient with pulmonary.  History of migraines As needed analgesics.  Obesity BMI 33 Recommend weight loss outpatient with regular physical activity and healthy dieting.    Time: 75 minutes.    DVT prophylaxis: Subcu Lovenox  daily.  Code Status: Full code.  Family Communication: None at bedside.  Disposition Plan: Admitted to progressive care unit.  Consults called: None.  Admission status: Observation status.   Status is: Observation    Terry LOISE Hurst MD [facility] Pager [phone]  If 7PM-7AM, please contact night-coverage [email or web address] Password [email or web address]  05/21/2024, 11:41 PM      [1]  Allergies  Allergen Reactions   Labetalol Nausea And Vomiting   Percocet [Oxycodone -Acetaminophen ] Nausea And Vomiting   Propranolol Other (See Comments)    gives me migraines   "

## 2024-05-21 NOTE — ED Notes (Signed)
Lab notified of urine culture add on 

## 2024-05-21 NOTE — ED Provider Notes (Signed)
 Signout from [employer], PA-C at shift change. Briefly, patient presents for fever, urinary urgency and flank pain x3 days. History of kidney stones   Plan: Patient remained mildly tachycardic and febrile. Concerns for pyelonephritis but blood work reassuring. given fluids and toradol , plan to reassess vs.    6:50 PM Reassessment performed. Patient appears resting comfortably in the bed but is still tachycardic.  Fever has improved to 99.9.  Heart rate remains around 125.  States she just feels tired and is still having some flank pain.  Labs and imaging personally reviewed and interpreted including: UA significant for UTI.  Normal creatinine and no leukocytosis.  Lactic acid normal.  Right ureteral wall enhancement on CT abdomen pelvis.  No nephrolithiasis.   Reviewed additional pertinent lab work and imaging with patient at bedside including: Findings discussed with patient.  She is still tachycardic after 2 L of fluids, will admit for suspected pyelonephritis and borderline sepsis.   Most current vital signs reviewed and are as follows: BP (!) 129/107   Pulse (!) 109   Temp (!) 102.7 F (39.3 C) (Oral)   Resp (!) 24   LMP  (LMP Unknown)   SpO2 97%     Plan: Hospitalist consulted and are agreeable to admit patient for further evaluation management.  Patient given Rocephin  and 2 L of fluids. She is stable and awaiting transfer at this time.           Neysa Thersia RAMAN, PA-C 05/21/24 2053    Ula Prentice SAUNDERS, MD 05/21/24 [phone]

## 2024-05-21 NOTE — ED Triage Notes (Signed)
 BIL flank pain, increased urinary urgency, decreased output for 3 days . Abnormal smell to urine  Denies dysuria, hematuria

## 2024-05-21 NOTE — ED Provider Notes (Signed)
 " [place] [facility] Provider Note   CSN: [id number] Arrival date & time: 05/21/24  1506     Patient presents with: Flank Pain   Heather Gomez is a 59 y.o. female who presents to the emergency department with a chief complaint of bilateral flank pain as well as decreased urinary output.  Patient states that she initially had urinary urgency however over the last 3 days this is decreased and now she only urinates a very small amount whenever she goes to use the restroom.  Patient denies dysuria or hematuria.  She does appreciate a fever at home as well as body aches and chills.  She does have a history of needing stent placement for kidney stones, and according to the patient has a history of bilateral obstructing stones where she needed to see urology.  Denies chest pain, shortness of breath, cough, or congestion.  Past medical history significant for hypertension, TIA, migraines, depression, hyperlipidemia, kidney stones, etc.     Flank Pain       Prior to Admission medications  Medication Sig Start Date End Date Taking? Authorizing Provider  amLODipine  (NORVASC ) 5 MG tablet Take 1 tablet (5 mg total) by mouth daily. 06/03/23     amoxicillin  (AMOXIL ) 500 MG capsule Take 1 capsule by mouth 2 times daily for 5 days 02/02/21     amoxicillin  (AMOXIL ) 500 MG capsule Take 1 capsule (500 mg total) by mouth 3 (three) times daily for 10 days 02/15/23     aspirin  EC 81 MG EC tablet Take 1 tablet (81 mg total) by mouth daily. 02/17/19   Vann, Jessica U, DO  atorvastatin  (LIPITOR) 40 MG tablet Take 1 tablet (40 mg total) by mouth daily at 6 PM. 02/16/19   Juvenal Harlene PENNER, DO  atorvastatin  (LIPITOR) 40 MG tablet TAKE ONE TABLET BY MOUTH EVERY EVENING AT [facility] 06/23/20 06/23/21  Frederik Charleston, MD  atorvastatin  (LIPITOR) 40 MG tablet TAKE ONE TABLET BY MOUTH EVERY EVENING AT [facility] 04/07/20 04/07/21  Frederik Charleston, MD  atorvastatin  (LIPITOR) 40 MG tablet TAKE 1  TABLET BY MOUTH EVERY EVENING AT [facility] 05/09/20 05/09/21  Frederik Charleston, MD  atorvastatin  (LIPITOR) 40 MG tablet TAKE ONE TABLET BY MOUTH EVERY EVENING AT 6PM 08/05/20     atorvastatin  (LIPITOR) 40 MG tablet Take 1 tablet (40 mg total) by mouth every evening at 6 pm. 08/02/22     atorvastatin  (LIPITOR) 40 MG tablet Take 1 tablet (40 mg total) by mouth daily. 02/17/24     carvedilol  (COREG ) 12.5 MG tablet TAKE ONE TABLET BY MOUTH TWICE DAILY WITH FOOD 02/12/20 02/11/21  Frederik Charleston, MD  carvedilol  (COREG ) 12.5 MG tablet TAKE 1 TABLET BY MOUTH TWO TIMES DAILY WITH FOOD 02/11/20 02/10/21  Frederik Charleston, MD  carvedilol  (COREG ) 12.5 MG tablet Take 1 tablet (12.5 mg total) by mouth 2 (two) times daily with food 06/23/22     carvedilol  (COREG ) 25 MG tablet Take 1 tablet (25 mg total) by mouth 2 (two) times daily with food. 10/10/23     carvedilol  (COREG ) 25 MG tablet Take 1 tablet (25 mg total) by mouth 2 (two) times daily after a meal. 05/18/24     carvedilol  (COREG ) 6.25 MG tablet Take 1 tablet (6.25 mg total) by mouth 2 (two) times daily with a meal. 02/16/19   Juvenal Harlene PENNER, DO  cephALEXin  (KEFLEX ) 500 MG capsule Take 1 capsule (500 mg total) by mouth 3 (three) times daily. 06/20/22  ciprofloxacin  (CIPRO ) 250 MG tablet Take 1 tablet (250 mg total) by mouth every 12 (twelve) hours for 5 days 06/23/22     ciprofloxacin  (CIPRO ) 250 MG tablet Take 1 tablet (250 mg total) by mouth every 12 (twelve) hours for 5 days. 06/03/23     clopidogrel  (PLAVIX ) 75 MG tablet Take 1 tablet (75 mg total) by mouth daily. 02/16/19   Vann, Jessica U, DO  cyclobenzaprine  (FLEXERIL ) 10 MG tablet Take 10 mg by mouth 3 (three) times daily as needed for muscle spasms.     [provider]  cyclobenzaprine  (FLEXERIL ) 10 MG tablet take 1 tablet by mouth 3 times a day as needed 05/06/21     cyclobenzaprine  (FLEXERIL ) 10 MG tablet Take 1 tablet (10 mg total) by mouth in the morning, at noon, and at bedtime. 02/17/24      fluorouracil  (EFUDEX ) 5 % cream Apply topically to affected areas on nose 2 (two) times daily for 1-2 weeks. 01/05/24     gabapentin  (NEURONTIN ) 300 MG capsule take 1 capsule by mouth once a day on day one, then twice a day on day 2; may increase to 3 times a day if needed for pain 30 day(s) 10/01/20     Galcanezumab -gnlm (EMGALITY ) 120 MG/ML SOAJ Inject 120 mg into the skin every 30 (thirty) days. 06/20/23     Galcanezumab -gnlm (EMGALITY ) 120 MG/ML SOAJ Inject 120 mg into the skin every 30 (thirty) days. 01/31/24     Galcanezumab -gnlm (EMGALITY ) 120 MG/ML SOAJ Inject 120 mg into the skin every 30 (thirty) days for migraine prevention. 02/01/24     HYDROcodone -acetaminophen  (NORCO/VICODIN) 5-325 MG tablet Take one tablet by mouth every 4-6 hours as needed for pain. 09/10/20     HYDROcodone -acetaminophen  (NORCO/VICODIN) 5-325 MG tablet Take 1 tablet by mouth every 8 (eight) hours as needed. *Max daily dose 3 tablets* 06/20/22     HYDROcodone -acetaminophen  (NORCO/VICODIN) 5-325 MG tablet Take 1 tablet by mouth every 4-6 hours as needed for pain. 05/10/24     ibuprofen  (ADVIL ) 600 MG tablet Take 1 tablet (600 mg total) by mouth every 6 (six) hours as needed for pain or fever 06/20/22     ketorolac  (TORADOL ) 10 MG tablet Take 1 tablet (10 mg total) by mouth every 6 (six) hours as needed for pain 06/25/22     lamoTRIgine  (LAMICTAL ) 100 MG tablet Take 100 mg by mouth 2 (two) times daily.    [provider]  lamoTRIgine  (LAMICTAL ) 100 MG tablet TAKE 4 TABLETS BY MOUTH DAILY **LAST REFILL UNTIL OFFICE VISIT** 05/06/20 05/06/21  Oneita Na, MD  lamoTRIgine  (LAMICTAL ) 100 MG tablet TAKE 4 TABLETS BY MOUTH DAILY FOR 30 DAYS 03/24/20 03/24/21  Oneita Na, MD  lamoTRIgine  (LAMICTAL ) 100 MG tablet TAKE 4 TABLETS BY MOUTH EVERYDAY 08/21/19 08/20/20  Oneita Na, MD  lamoTRIgine  (LAMICTAL ) 100 MG tablet TAKE 4 TABLETS BY MOUTH DAILY 06/19/20 06/19/21  Oneita Na, MD  lamoTRIgine  (LAMICTAL ) 100 MG  tablet Take 4 tablets (400 mg total) by mouth daily. 02/22/22     lamoTRIgine  (LAMICTAL ) 100 MG tablet Take 2 tablets (200 mg total) by mouth daily. 04/26/23     lamoTRIgine  (LAMICTAL ) 100 MG tablet Take 2 tablets (200 mg total) by mouth daily. 06/20/23     lamoTRIgine  (LAMICTAL ) 100 MG tablet Take 2 tablets (200 mg total) by mouth daily. 08/31/23     lamoTRIgine  (LAMICTAL ) 100 MG tablet Take 2 tablets (200 mg total) by mouth daily. 02/01/24     lidocaine  (LIDODERM ) 5 %  Place one patch onto the skin daily for 30 days. Remove & Discard patch within 12 hours or as directed by MD 04/19/22     lisinopril -hydrochlorothiazide  (ZESTORETIC ) 10-12.5 MG tablet TAKE 1 TABLET BY MOUTH ONCE DAILY 06/23/20 06/23/21  Frederik Charleston, MD  lisinopril -hydrochlorothiazide  (ZESTORETIC ) 10-12.5 MG tablet TAKE ONE TABLET BY MOUTH ONCE DAILY 04/07/20 04/07/21  Frederik Charleston, MD  lisinopril -hydrochlorothiazide  (ZESTORETIC ) 10-12.5 MG tablet TAKE 1 TABLET BY MOUTH ONCE DAILY 01/08/20 01/07/21  Frederik Charleston, MD  lisinopril -hydrochlorothiazide  (ZESTORETIC ) 10-12.5 MG tablet Take 1 tablet by mouth daily. 08/21/21     lisinopril -hydrochlorothiazide  (ZESTORETIC ) 10-12.5 MG tablet Take 2 tablets by mouth daily. 07/11/23     meloxicam  (MOBIC ) 15 MG tablet Take 1 tablet by mouth once a day 10/10/20     methylPREDNISolone  (MEDROL  DOSEPAK) 4 MG TBPK tablet Take as directed by mouth per package instructions 02/15/23     Multiple Vitamin (MULTIVITAMIN WITH MINERALS) TABS tablet Take 1 tablet by mouth daily.    [provider]  nitrofurantoin , macrocrystal-monohydrate, (MACROBID ) 100 MG capsule Take 1 capsule (100 mg total) by mouth 2 (two) times daily for 7 days 04/28/23     ondansetron  (ZOFRAN -ODT) 4 MG disintegrating tablet Take 1 tablet (4 mg total) by mouth every 8 (eight) hours as needed for nausea (and vomiting) 06/20/22     oxyCODONE -acetaminophen  (PERCOCET) 5-325 MG tablet Take 1 (one) Tablet by mouth two times daily, as needed  10/09/20     pantoprazole  (PROTONIX ) 40 MG tablet Take 1 tablet by mouth daily 06/25/21     pantoprazole  (PROTONIX ) 40 MG tablet Take 1 tablet (40 mg total) by mouth every morning. 05/25/22     pantoprazole  (PROTONIX ) 40 MG tablet Take 1 tablet (40 mg total) by mouth every morning. 05/25/22     pantoprazole  (PROTONIX ) 40 MG tablet Take 1 tablet (40 mg total) by mouth every morning. 03/26/24     promethazine  (PHENERGAN ) 25 MG tablet Take 25 mg by mouth every 6 (six) hours as needed for nausea or vomiting.     [provider]  promethazine  (PHENERGAN ) 25 MG tablet Take 1 tablet (25 mg total) by mouth every 6 (six) hours as needed. 04/30/22     promethazine  (PHENERGAN ) 25 MG tablet Take 1 tablet (25 mg total) by mouth every 8 (eight) hours as needed for nausea 02/17/24     sertraline  (ZOLOFT ) 100 MG tablet Take 100 mg by mouth at bedtime.     [provider]  sertraline  (ZOLOFT ) 100 MG tablet TAKE ONE TABLET BY MOUTH ONCE DAILY 07/14/20 07/14/21  Frederik Charleston, MD  sertraline  (ZOLOFT ) 100 MG tablet TAKE ONE TABLET BY MOUTH ONCE DAILY 02/12/20 02/11/21  Frederik Charleston, MD  sertraline  (ZOLOFT ) 100 MG tablet TAKE ONE TABLET BY MOUTH ONCE DAILY 90 days 08/05/20     sertraline  (ZOLOFT ) 100 MG tablet Take 1 tablet (100 mg total) by mouth daily. 11/19/22     sertraline  (ZOLOFT ) 100 MG tablet Take 1 tablet (100 mg total) by mouth daily. 02/17/24     silver  sulfADIAZINE  (SILVADENE ) 1 % cream Apply 1 application on to the skin 2 times daily for 7 days 10/09/20     Sodium Sulfate-Mag Sulfate-KCl (SUTAB ) [id number] MG TABS Use As directed on package 05/25/22     tamsulosin  (FLOMAX ) 0.4 MG CAPS capsule Take 1 capsule (0.4 mg total) by mouth daily. 06/25/22       Allergies: Labetalol, Percocet [oxycodone -acetaminophen ], and Propranolol    Review of Systems  Genitourinary:  Positive for  flank pain.    Updated Vital Signs BP (!) 129/107   Pulse (!) 109   Temp (!) 102.7 F (39.3 C) (Oral)   Resp (!)  24   LMP  (LMP Unknown)   SpO2 97%   Physical Exam Vitals and nursing note reviewed.  Constitutional:      General: She is awake. She is not in acute distress.    Appearance: Normal appearance. She is not ill-appearing, toxic-appearing or diaphoretic.  HENT:     Head: Normocephalic and atraumatic.  Eyes:     General: No scleral icterus. Cardiovascular:     Rate and Rhythm: Normal rate and regular rhythm.  Pulmonary:     Effort: Pulmonary effort is normal. No respiratory distress.     Breath sounds: No stridor. Wheezing (mild generalized wheezing present) present. No rhonchi or rales.  Abdominal:     General: Abdomen is flat. There is no distension.     Palpations: Abdomen is soft.     Tenderness: There is abdominal tenderness (Generalized abdominal tenderness present worse on R side, bilateral flanks also mildly tender to palpation). There is no right CVA tenderness, left CVA tenderness, guarding or rebound.  Musculoskeletal:        General: Normal range of motion.     Right lower leg: No edema.     Left lower leg: No edema.  Skin:    General: Skin is warm.     Capillary Refill: Capillary refill takes less than 2 seconds.     Comments: No obvious rashes or lesions  Neurological:     General: No focal deficit present.     Mental Status: She is alert and oriented to person, place, and time.  Psychiatric:        Mood and Affect: Mood normal.        Behavior: Behavior normal. Behavior is cooperative.     (all labs ordered are listed, but only abnormal results are displayed) Labs Reviewed  COMPREHENSIVE METABOLIC PANEL WITH GFR - Abnormal; Notable for the following components:      Result Value   CO2 20 (*)    Anion gap 16 (*)    All other components within normal limits  CBC WITH DIFFERENTIAL/PLATELET - Abnormal; Notable for the following components:   Hemoglobin 15.5 (*)    MCH 34.4 (*)    Neutro Abs 8.0 (*)    All other components within normal limits  URINALYSIS, W/  REFLEX TO CULTURE (INFECTION SUSPECTED) - Abnormal; Notable for the following components:   APPearance CLOUDY (*)    Hgb urine dipstick LARGE (*)    Protein, ur 100 (*)    Bacteria, UA FEW (*)    All other components within normal limits  URINE CULTURE  LACTIC ACID, PLASMA    EKG: None  Radiology: CT ABDOMEN PELVIS W CONTRAST Result Date: 05/21/2024 EXAM: CT ABDOMEN AND PELVIS WITH CONTRAST 05/21/2024 05:05:34 PM TECHNIQUE: CT of the abdomen and pelvis was performed with the administration of 100 mL of iohexol  (OMNIPAQUE ) 300 MG/ML solution. Multiplanar reformatted images are provided for review. Automated exposure control, iterative reconstruction, and/or weight-based adjustment of the mA/kV was utilized to reduce the radiation dose to as low as reasonably achievable. COMPARISON: 07/16/2018 CLINICAL HISTORY: Bilateral flank pain, febrile, history of kidney stones, generalized abdominal pain. FINDINGS: LOWER CHEST: No acute abnormality. LIVER: The liver is unremarkable. GALLBLADDER AND BILE DUCTS: The gallbladder has been surgically removed. No biliary ductal dilatation. SPLEEN: No acute abnormality. PANCREAS: No acute  abnormality. ADRENAL GLANDS: No acute abnormality. 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 Bilateral simple renal cysts are noted. Per consensus, no follow-up is needed for simple Bosniak type 1 and 2 renal cysts, unless the patient has a malignancy history or risk factors. The right ureter demonstrates diffuse wall enhancement, which may represent an underlying UTI. No obstructing stone is seen. No hydronephrosis. No perinephric or periureteral stranding. The bladder is within normal limits. GI AND BOWEL: Scattered diverticular changes of the colon are noted without evidence of diverticulitis. The appendix is within normal limits. Small bowel and stomach are unremarkable. There is no bowel obstruction. PERITONEUM AND RETROPERITONEUM: No ascites. No free  air. VASCULATURE: Aortic calcifications are seen without aneurysmal dilatation. LYMPH NODES: No lymphadenopathy. REPRODUCTIVE ORGANS: The uterus is within normal limits. No adnexal mass is seen. BONES AND SOFT TISSUES: No acute osseous abnormality. No focal soft tissue abnormality. IMPRESSION: 1. Diffuse right ureteral wall enhancement, which may represent urinary tract infection, without obstructing stone. 2. 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 3. Diverticulosis without diverticulitis. Electronically signed by: Oneil Devonshire MD 05/21/2024 05:20 PM EST RP Workstation: [id number]   DG Chest 2 View Result Date: 05/21/2024 EXAM: 2 VIEW(S) XRAY OF THE CHEST 05/21/2024 03:37:00 PM COMPARISON: Chest CT 10/04/2023 and chest radiograph 02/15/2019. CLINICAL HISTORY: Infection. ICD10: [id number] Toxic effect of unspecified substance, accidental (unintentional), initial encounter. FINDINGS: LUNGS AND PLEURA: Lungs are clear. No pleural effusion. No pneumothorax. HEART AND MEDIASTINUM: Heart size and pulmonary vascularity are normal. Mediastinal contours appear intact. Tortuous aorta. BONES AND SOFT TISSUES: No acute osseous abnormality. IMPRESSION: 1. No acute cardiopulmonary abnormality. Electronically signed by: Elsie Gravely MD 05/21/2024 03:43 PM EST RP Workstation: [id number]     Procedures   Medications Ordered in the ED  acetaminophen  (TYLENOL ) tablet 1,000 mg (1,000 mg Oral Given 05/21/24 1627)  sodium chloride  0.9 % bolus 1,000 mL (0 mLs Intravenous Stopped 05/21/24 1757)  HYDROmorphone  (DILAUDID ) injection 0.5 mg (0.5 mg Intravenous Given 05/21/24 1622)  ondansetron  (ZOFRAN ) injection 4 mg (4 mg Intravenous Given 05/21/24 1624)  iohexol  (OMNIPAQUE ) 300 MG/ML solution 100 mL (100 mLs Intravenous Contrast Given 05/21/24 1643)  sodium chloride  0.9 % bolus 1,000 mL (1,000 mLs Intravenous New Bag/Given 05/21/24 1756)  cefTRIAXone  (ROCEPHIN ) 1 g in sodium chloride  0.9 % 100 mL IVPB (1 g Intravenous  New Bag/Given 05/21/24 1758)  ketorolac  (TORADOL ) 15 MG/ML injection 15 mg (15 mg Intravenous Given 05/21/24 1829)                                    Medical Decision Making Amount and/or Complexity of Data Reviewed Labs: ordered. Radiology: ordered.  Risk OTC drugs. Prescription drug management.   Patient presents to the ED for concern of abdominal pain, urinary frequency, decreased urination, flank pain, low back pain, fever, this involves an extensive number of treatment options, and is a complaint that carries with it a high risk of complications and morbidity.  The differential diagnosis includes pyelonephritis, urinary tract infection, sepsis, obstructive kidney stone, etc.   Co morbidities that complicate the patient evaluation  hypertension, TIA, migraines, depression, hyperlipidemia, kidney stones   Additional history obtained:  Reviewed most recent PCP encounters which state patient has had previous renal stents (2009), patient also reports lithotripsy   Lab Tests:  I Ordered, and personally interpreted labs.  The pertinent results include: CBC significant for no leukocytosis, hemoglobin of 15.5, mildly elevated neutrophil  count at 8, CMP largely unremarkable other than mildly decreased bicarb, anion gap 16, creatinine normal, GFR estimated to be greater than 60, lactic acid 1.9, urinalysis significant for cloudy appearance, large hemoglobin, 100 protein, few bacteria, 21-50 white blood cells, 6-10 red blood cells   Imaging Studies ordered:  I ordered imaging studies including CXR (ordered from triage), CT abdomen pelvis with contrast I independently visualized and interpreted imaging which showed  CXR: No acute cardiopulmonary abnormality CT abdomen pelvis: Diffuse right ureteral wall enhancement which may represent UTI without obstructing stone, medullary nephrocalcinosis without obstructive changes, diverticulosis without diverticulitis I agree with the radiologist  interpretation   Cardiac Monitoring:  The patient was maintained on a cardiac monitor.  I personally viewed and interpreted the cardiac monitored which showed an underlying rhythm of: Sinus tachycardia   Medicines ordered and prescription drug management:  I ordered medication including Tylenol  for fever and body pain, fluids for tachycardia, Rocephin  for possible UTI, Toradol  for pain and fever, Dilaudid  for pain, Zofran  for nausea Reevaluation of the patient after these medicines showed that the patient improved I have reviewed the patients home medicines and have made adjustments as needed   Test Considered:  None   Critical Interventions:  None   Problem List / ED Course:  59 year old female, vital signs significant for fever, tachycardia, as well as hypertension, presents to the emergency department for chief complaint of bilateral flank pain as well as decreased urination and fever, history of kidney stones On physical exam patient is uncomfortable appearing however nontoxic, abdomen generally tender, tender over the low back area however no true CVA tenderness Lab work ordered from triage largely reassuring with no leukocytosis, lactic acid not elevated, due to history of fever, bilateral flank pain, with history of kidney stones and decreased urination will order CT abdomen pelvis to assess for possible obstructive stone, will treat symptomatically in the meantime CT abdomen pelvis significant for findings consistent with urinary tract infection given ureter wall enhancement, given that the patient is febrile with flank pain and symptoms of a UTI, clinically I do believe this is consistent with pyelonephritis, will cover with IV dose of ceftriaxone  and also culture urine On reassessment patient pain is somewhat better controlled, tachycardia has improved from 130s to 109, temperature however has actually increased, will trial Toradol  due to fever and continued pain Patient  signed out at time of shift change to [employer] PA-C who assumes care over this patient, further work-up, and ultimately disposition, plan at this time is for reassessment after continued fluids and Toradol , patient may be discharged with antibiotic therapy if fever and tachycardia improve, however if symptoms persist and vitals remain elevated may consider admission Most likely diagnosis at this time is pyelonephritis leading to abdominal and flank pain as well as fever and urinary symptoms, no evidence of obstructive stone   Reevaluation:  After the interventions noted above, I reevaluated the patient and found that they have :improved   Social Determinants of Health:  none   Dispostion:  Patient signed out at time of shift change to [employer] who assumes care over this patient, further work-up, and ultimately disposition, plan at this time is for reassessment after continued fluids and Toradol , patient may be discharged with antibiotic therapy if fever and tachycardia improve, however if symptoms persist and vitals remain elevated may consider admission     Final diagnoses:  Pyelonephritis  Acute cystitis with hematuria    ED Discharge Orders  None          Janetta Terrall JULIANNA DEVONNA 05/21/24 RETHA Ula Prentice JONELLE, MD 05/21/24 2251  "

## 2024-05-22 DIAGNOSIS — D696 Thrombocytopenia, unspecified: Secondary | ICD-10-CM | POA: Diagnosis present

## 2024-05-22 DIAGNOSIS — I10 Essential (primary) hypertension: Secondary | ICD-10-CM | POA: Diagnosis present

## 2024-05-22 DIAGNOSIS — Z8249 Family history of ischemic heart disease and other diseases of the circulatory system: Secondary | ICD-10-CM | POA: Diagnosis not present

## 2024-05-22 DIAGNOSIS — E66811 Obesity, class 1: Secondary | ICD-10-CM | POA: Diagnosis present

## 2024-05-22 DIAGNOSIS — Z6833 Body mass index (BMI) 33.0-33.9, adult: Secondary | ICD-10-CM | POA: Diagnosis not present

## 2024-05-22 DIAGNOSIS — B9620 Unspecified Escherichia coli [E. coli] as the cause of diseases classified elsewhere: Secondary | ICD-10-CM | POA: Diagnosis present

## 2024-05-22 DIAGNOSIS — N10 Acute tubulo-interstitial nephritis: Secondary | ICD-10-CM

## 2024-05-22 DIAGNOSIS — K219 Gastro-esophageal reflux disease without esophagitis: Secondary | ICD-10-CM | POA: Diagnosis present

## 2024-05-22 DIAGNOSIS — N3001 Acute cystitis with hematuria: Secondary | ICD-10-CM | POA: Diagnosis present

## 2024-05-22 DIAGNOSIS — E872 Acidosis, unspecified: Secondary | ICD-10-CM | POA: Diagnosis present

## 2024-05-22 DIAGNOSIS — J439 Emphysema, unspecified: Secondary | ICD-10-CM | POA: Diagnosis present

## 2024-05-22 DIAGNOSIS — N12 Tubulo-interstitial nephritis, not specified as acute or chronic: Secondary | ICD-10-CM | POA: Diagnosis present

## 2024-05-22 DIAGNOSIS — E8809 Other disorders of plasma-protein metabolism, not elsewhere classified: Secondary | ICD-10-CM | POA: Diagnosis present

## 2024-05-22 DIAGNOSIS — Z1611 Resistance to penicillins: Secondary | ICD-10-CM | POA: Diagnosis present

## 2024-05-22 DIAGNOSIS — Z79899 Other long term (current) drug therapy: Secondary | ICD-10-CM | POA: Diagnosis not present

## 2024-05-22 DIAGNOSIS — F17210 Nicotine dependence, cigarettes, uncomplicated: Secondary | ICD-10-CM | POA: Diagnosis present

## 2024-05-22 DIAGNOSIS — Z7902 Long term (current) use of antithrombotics/antiplatelets: Secondary | ICD-10-CM | POA: Diagnosis not present

## 2024-05-22 DIAGNOSIS — E785 Hyperlipidemia, unspecified: Secondary | ICD-10-CM | POA: Diagnosis present

## 2024-05-22 DIAGNOSIS — E876 Hypokalemia: Secondary | ICD-10-CM | POA: Diagnosis present

## 2024-05-22 DIAGNOSIS — Z7982 Long term (current) use of aspirin: Secondary | ICD-10-CM | POA: Diagnosis not present

## 2024-05-22 DIAGNOSIS — Z885 Allergy status to narcotic agent status: Secondary | ICD-10-CM | POA: Diagnosis not present

## 2024-05-22 DIAGNOSIS — F32A Depression, unspecified: Secondary | ICD-10-CM | POA: Diagnosis present

## 2024-05-22 DIAGNOSIS — A419 Sepsis, unspecified organism: Secondary | ICD-10-CM | POA: Diagnosis present

## 2024-05-22 LAB — COMPREHENSIVE METABOLIC PANEL WITH GFR
ALT: 9 U/L (ref 0–44)
AST: 20 U/L (ref 15–41)
Albumin: 3.3 g/dL — ABNORMAL LOW (ref 3.5–5.0)
Alkaline Phosphatase: 66 U/L (ref 38–126)
Anion gap: 11 (ref 5–15)
BUN: 12 mg/dL (ref 6–20)
CO2: 21 mmol/L — ABNORMAL LOW (ref 22–32)
Calcium: 8.4 mg/dL — ABNORMAL LOW (ref 8.9–10.3)
Chloride: 105 mmol/L (ref 98–111)
Creatinine, Ser: 1.02 mg/dL — ABNORMAL HIGH (ref 0.44–1.00)
GFR, Estimated: >60 mL/min
Glucose, Bld: 94 mg/dL (ref 70–99)
Potassium: 3.1 mmol/L — ABNORMAL LOW (ref 3.5–5.1)
Sodium: 137 mmol/L (ref 135–145)
Total Bilirubin: 0.5 mg/dL (ref 0.0–1.2)
Total Protein: 6.3 g/dL — ABNORMAL LOW (ref 6.5–8.1)

## 2024-05-22 LAB — DIFFERENTIAL
Abs Immature Granulocytes: 0.13 10*3/uL — ABNORMAL HIGH (ref 0.00–0.07)
Basophils Absolute: 0 10*3/uL (ref 0.0–0.1)
Basophils Relative: 0 %
Eosinophils Absolute: 0 10*3/uL (ref 0.0–0.5)
Eosinophils Relative: 0 %
Immature Granulocytes: 1 %
Lymphocytes Relative: 9 %
Lymphs Abs: 1.3 10*3/uL (ref 0.7–4.0)
Monocytes Absolute: 0.7 10*3/uL (ref 0.1–1.0)
Monocytes Relative: 5 %
Neutro Abs: 12.5 10*3/uL — ABNORMAL HIGH (ref 1.7–7.7)
Neutrophils Relative %: 85 %

## 2024-05-22 LAB — URINALYSIS, W/ REFLEX TO CULTURE (INFECTION SUSPECTED)
Bilirubin Urine: NEGATIVE
Glucose, UA: NEGATIVE mg/dL
Ketones, ur: NEGATIVE mg/dL
Nitrite: POSITIVE — AB
Protein, ur: 100 mg/dL — AB
Specific Gravity, Urine: <=1.005 (ref 1.005–1.030)
pH: 6.5 (ref 5.0–8.0)

## 2024-05-22 LAB — CREATININE, SERUM
Creatinine, Ser: 1.08 mg/dL — ABNORMAL HIGH (ref 0.44–1.00)
GFR, Estimated: 59 mL/min — ABNORMAL LOW

## 2024-05-22 LAB — CBC
HCT: 39 % (ref 36.0–46.0)
Hemoglobin: 13.5 g/dL (ref 12.0–15.0)
MCH: 34.2 pg — ABNORMAL HIGH (ref 26.0–34.0)
MCHC: 34.6 g/dL (ref 30.0–36.0)
MCV: 98.7 fL (ref 80.0–100.0)
Platelets: 130 10*3/uL — ABNORMAL LOW (ref 150–400)
RBC: 3.95 MIL/uL (ref 3.87–5.11)
RDW: 12.9 % (ref 11.5–15.5)
WBC: 14.3 10*3/uL — ABNORMAL HIGH (ref 4.0–10.5)
nRBC: 0 % (ref 0.0–0.2)

## 2024-05-22 LAB — PHOSPHORUS: Phosphorus: 3 mg/dL (ref 2.5–4.6)

## 2024-05-22 LAB — MAGNESIUM: Magnesium: 1.6 mg/dL — ABNORMAL LOW (ref 1.7–2.4)

## 2024-05-22 MED ORDER — PROMETHAZINE HCL 25 MG PO TABS
25.0000 mg | ORAL_TABLET | Freq: Three times a day (TID) | ORAL | Status: DC | PRN
  Administered 2024-05-22: 25 mg via ORAL
  Filled 2024-05-22 (×2): qty 1

## 2024-05-22 MED ORDER — OXYCODONE HCL 5 MG PO TABS: 5.0000 mg | ORAL_TABLET | ORAL | Status: DC | PRN

## 2024-05-22 MED ORDER — MAGNESIUM SULFATE 2 GM/50ML IV SOLN
2.0000 g | Freq: Once | INTRAVENOUS | Status: AC
  Administered 2024-05-22: 2 g via INTRAVENOUS
  Filled 2024-05-22: qty 50

## 2024-05-22 MED ORDER — ASPIRIN 81 MG PO TBEC
81.0000 mg | DELAYED_RELEASE_TABLET | Freq: Every day | ORAL | Status: DC
  Administered 2024-05-22 – 2024-05-23 (×2): 81 mg via ORAL
  Filled 2024-05-22 (×2): qty 1

## 2024-05-22 MED ORDER — SODIUM CHLORIDE 0.9 % IV SOLN
12.5000 mg | Freq: Four times a day (QID) | INTRAVENOUS | Status: DC | PRN
  Administered 2024-05-22: 12.5 mg via INTRAVENOUS
  Filled 2024-05-22: qty 12.5

## 2024-05-22 MED ORDER — PANTOPRAZOLE SODIUM 40 MG PO TBEC
40.0000 mg | DELAYED_RELEASE_TABLET | Freq: Every morning | ORAL | Status: DC
  Administered 2024-05-22 – 2024-05-23 (×2): 40 mg via ORAL
  Filled 2024-05-22 (×2): qty 1

## 2024-05-22 MED ORDER — POTASSIUM CHLORIDE CRYS ER 20 MEQ PO TBCR
40.0000 meq | EXTENDED_RELEASE_TABLET | Freq: Two times a day (BID) | ORAL | Status: AC
  Administered 2024-05-22 (×2): 40 meq via ORAL
  Filled 2024-05-22 (×2): qty 2

## 2024-05-22 MED ORDER — LACTATED RINGERS IV SOLN: INTRAVENOUS | Status: AC

## 2024-05-22 MED ORDER — LAMOTRIGINE 100 MG PO TABS
200.0000 mg | ORAL_TABLET | Freq: Every day | ORAL | Status: DC
  Administered 2024-05-22 – 2024-05-23 (×2): 200 mg via ORAL
  Filled 2024-05-22 (×2): qty 2

## 2024-05-22 MED ORDER — SODIUM CHLORIDE 0.9 % IV SOLN
2.0000 g | INTRAVENOUS | Status: DC
  Administered 2024-05-22: 2 g via INTRAVENOUS
  Filled 2024-05-22: qty 20

## 2024-05-22 MED ORDER — CYCLOBENZAPRINE HCL 10 MG PO TABS
10.0000 mg | ORAL_TABLET | Freq: Three times a day (TID) | ORAL | Status: DC | PRN
  Administered 2024-05-22: 10 mg via ORAL
  Filled 2024-05-22 (×2): qty 1

## 2024-05-22 MED ORDER — ACETAMINOPHEN 325 MG PO TABS
650.0000 mg | ORAL_TABLET | Freq: Four times a day (QID) | ORAL | Status: DC | PRN
  Administered 2024-05-22: 650 mg via ORAL
  Filled 2024-05-22 (×2): qty 2

## 2024-05-22 MED ORDER — HYDROMORPHONE HCL 1 MG/ML IJ SOLN
0.5000 mg | INTRAMUSCULAR | Status: DC | PRN
  Administered 2024-05-22: 1 mg via INTRAVENOUS
  Filled 2024-05-22: qty 1

## 2024-05-22 MED ORDER — ATORVASTATIN CALCIUM 40 MG PO TABS
40.0000 mg | ORAL_TABLET | Freq: Every day | ORAL | Status: DC
  Administered 2024-05-22 – 2024-05-23 (×2): 40 mg via ORAL
  Filled 2024-05-22 (×2): qty 1

## 2024-05-22 MED ORDER — SERTRALINE HCL 100 MG PO TABS
100.0000 mg | ORAL_TABLET | Freq: Every day | ORAL | Status: DC
  Administered 2024-05-22: 100 mg via ORAL
  Filled 2024-05-22: qty 1

## 2024-05-22 NOTE — Progress Notes (Signed)
" °   05/22/24 0955  TOC Brief Assessment  Insurance and Status Reviewed  Patient has primary care physician Yes  Home environment has been reviewed Resides in single family home with family  Prior level of function: Independent with ADLs at baseline  Prior/Current Home Services No current home services  Social Drivers of Health Review SDOH reviewed no interventions necessary  Readmission risk has been reviewed Yes  Transition of care needs no transition of care needs at this time    "

## 2024-05-22 NOTE — ED Notes (Signed)
 Lab called due to incorrect results, results reviewed with EDP and found pt was admitted to [facility] 1423. Messages sent to floor RN.

## 2024-05-22 NOTE — Hospital Course (Addendum)
 Heather Gomez is a 59 y.o. female with medical history significant for renal calculi, essential hypertension, hyperlipidemia, chronic anxiety/depression, current tobacco use, emphysema, pulmonary nodules, GERD, migraines, who presents to [facility] ER with complaints of bilateral flank pain and increase in urinary urgency for the past 3 days. Right side was worse than the Left.    In the ER, febrile with Tmax 101.2, tachycardic 136, tachypneic 24, UA positive for pyuria.  CT abdomen and pelvis with contrast revealed diffuse right ureteral wall enhancement which may represent urinary tract infection without obstructing stone.  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  Diverticulosis without diverticulitis.  Currently being admitted for sepsis secondary to right-sided pyelonephritis and is slowly improving.  Assessment and Plan:  Sepsis 2/2 to Right Pyelonephritis, poA:  Met SIRS Criteria Febrile Tmax 101.2, tachycardic 136, tachypneic 24 and Urinary Source. U/A showed a cloudy appearance with large hemoglobin, moderate leukocytes, positive nitrites, 100 protein, few bacteria, 6-10 RBCs per high-power field, 21-50 WBCs with Urine Cx and Blood Cx x2 pending.  -Lactic acid 1.9, reassuring; Check Procalcitonin in the AM -CT Abd/Pelvis showed Diffuse right ureteral wall enhancement, which may represent urinary tract infection, without obstructing stone. 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 Diverticulosis without diverticulitis. -Continue Rocephin  2 g for now; De-escalate antibiotics when able, based upon culture results. -WBC went from 10.1 -> 14.3. -Continue IV fluid hydration LR at 75 cc/h x 1 day. -CTM fever curve and WBCs and ensure to Maintain MAP greater than 65.  Essential Hypertension: C/w Carvedilol  3.125 mg po BID. CTM BP per Protocol. Last BP reading was 123/87.   Hyperlipidemia: C/w Atorvastatin  40 mg po Daily   Anxiety/Depression: Chronic. C/w  Sertraline  100 mg po Daily   Emphysema/Pulmonary Nodules: Recommend close follow-up outpatient with pulmonary and repeat CT Scan   History of Migraines: C/w Lamotrigine  200 mg po Daily, Cyclobenzaprine  10 mg po TID muscle spasms.  Metabolic Acidosis: Mild and improve. CO2 is now 21, Ag is 11, Chloride Level is 105. CTM & Trend and repeat CMP in the AM   Hypokalemia: K+ is now 3.1. Replete w/ po KCL 40 mEQ BID x2. CTM & Replete as Necessary. Repeat CMP in the AM   Hypomagnesemia: Mag Lvl is now 1.6. Replete w/ IV Mag Sulfate 2 grams. CTM & Replete as Necessary. Repeat Mag in the AM  Thrombocytopenia: Plt Count is now 130. CTM for S/Sx of Bleeding; No overt bleeding noted. Repeat CBC in the AM  GERD/ GI Prophylaxis: C/w Pantoprazole  40 mg po Daily  Current Tobacco User: Recommend complete cessation of tobacco use. Smokes 1 pack/day  Hypoalbuminemia: Patient's Albumin Lvl went from 4.3 -> 3.3. CTM & Trend & repeat CMP in the AM   Class I Obesity: Complicates overall prognosis and care. Estimated body mass index is 33.38 kg/m as calculated from the following:   Height as of this encounter: 5' 5 (1.651 m).   Weight as of this encounter: 91 kg. Weight Loss and Dietary Counseling given

## 2024-05-22 NOTE — Progress Notes (Signed)
 " PROGRESS NOTE    Heather Gomez  FMW:[id number] DOB: 03/26/1966 DOA: 05/21/2024 PCP: Frederik Charleston, MD   Brief Narrative:  Heather Gomez is a 59 y.o. female with medical history significant for renal calculi, essential hypertension, hyperlipidemia, chronic anxiety/depression, current tobacco use, emphysema, pulmonary nodules, GERD, migraines, who presents to [facility] ER with complaints of bilateral flank pain and increase in urinary urgency for the past 3 days. Right side was worse than the Left.    In the ER, febrile with Tmax 101.2, tachycardic 136, tachypneic 24, UA positive for pyuria.  CT abdomen and pelvis with contrast revealed diffuse right ureteral wall enhancement which may represent urinary tract infection without obstructing stone.  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  Diverticulosis without diverticulitis.  Currently being admitted for sepsis secondary to right-sided pyelonephritis and is slowly improving.  Assessment and Plan:  Sepsis 2/2 to Right Pyelonephritis, poA:  Met SIRS Criteria Febrile Tmax 101.2, tachycardic 136, tachypneic 24 and Urinary Source. U/A showed a cloudy appearance with large hemoglobin, moderate leukocytes, positive nitrites, 100 protein, few bacteria, 6-10 RBCs per high-power field, 21-50 WBCs with Urine Cx and Blood Cx x2 pending.  -Lactic acid 1.9, reassuring; Check Procalcitonin in the AM -CT Abd/Pelvis showed Diffuse right ureteral wall enhancement, which may represent urinary tract infection, without obstructing stone. 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 Diverticulosis without diverticulitis. -Continue Rocephin  2 g for now; De-escalate antibiotics when able, based upon culture results. -WBC went from 10.1 -> 14.3. -Continue IV fluid hydration LR at 75 cc/h x 1 day. -CTM fever curve and WBCs and ensure to Maintain MAP greater than 65.  Essential Hypertension: C/w Carvedilol  3.125 mg po BID. CTM  BP per Protocol. Last BP reading was 123/87.   Hyperlipidemia: C/w Atorvastatin  40 mg po Daily   Anxiety/Depression: Chronic. C/w Sertraline  100 mg po Daily   Emphysema/Pulmonary Nodules: Recommend close follow-up outpatient with pulmonary and repeat CT Scan   History of Migraines: C/w Lamotrigine  200 mg po Daily, Cyclobenzaprine  10 mg po TID muscle spasms.  Metabolic Acidosis: Mild and improve. CO2 is now 21, Ag is 11, Chloride Level is 105. CTM & Trend and repeat CMP in the AM   Hypokalemia: K+ is now 3.1. Replete w/ po KCL 40 mEQ BID x2. CTM & Replete as Necessary. Repeat CMP in the AM   Hypomagnesemia: Mag Lvl is now 1.6. Replete w/ IV Mag Sulfate 2 grams. CTM & Replete as Necessary. Repeat Mag in the AM  Thrombocytopenia: Plt Count is now 130. CTM for S/Sx of Bleeding; No overt bleeding noted. Repeat CBC in the AM  GERD/ GI Prophylaxis: C/w Pantoprazole  40 mg po Daily  Current Tobacco User: Recommend complete cessation of tobacco use. Smokes 1 pack/day  Hypoalbuminemia: Patient's Albumin Lvl went from 4.3 -> 3.3. CTM & Trend & repeat CMP in the AM   Class I Obesity: Complicates overall prognosis and care. Estimated body mass index is 33.38 kg/m as calculated from the following:   Height as of this encounter: 5' 5 (1.651 m).   Weight as of this encounter: 91 kg. Weight Loss and Dietary Counseling given   DVT prophylaxis: enoxaparin  (LOVENOX ) injection 40 mg Start: 05/22/24 1000    Code Status: Full Code Family Communication: No family currently at bedside but daughter listening on the speaker phone at bedside  Disposition Plan:  Level of care: Progressive Status is: Inpatient Remains inpatient appropriate because: Needs further clinical improvement   Consultants:  None  Procedures:  As delineated  as above  Antimicrobials:  Anti-infectives (From admission, onward)    Start     Dose/Rate Route Frequency Ordered Stop   05/22/24 1600  cefTRIAXone  (ROCEPHIN ) 2 g in  sodium chloride  0.9 % 100 mL IVPB        2 g 200 mL/hr over 30 Minutes Intravenous Every 24 hours 05/22/24 0128     05/22/24 1000  cefTRIAXone  (ROCEPHIN ) 2 g in sodium chloride  0.9 % 100 mL IVPB  Status:  Discontinued        2 g 200 mL/hr over 30 Minutes Intravenous Every 24 hours 05/21/24 2343 05/22/24 0128   05/21/24 1745  cefTRIAXone  (ROCEPHIN ) 1 g in sodium chloride  0.9 % 100 mL IVPB        1 g 200 mL/hr over 30 Minutes Intravenous  Once 05/21/24 1739 05/21/24 1828       Subjective: Seen and examined at bedside is feeling a little bit better today.  Still having some back and flank pain worse on the right.  No nausea or vomiting.  Denied any lightheadedness or dizziness.  No other concerns or complaints this time.  Objective: Vitals:   05/22/24 0000 05/22/24 0352 05/22/24 0753 05/22/24 1307  BP: 107/77 97/71 99/80  123/87  Pulse:  69 74 81  Resp:   15 15  Temp:  98 F (36.7 C) 98 F (36.7 C) 98.4 F (36.9 C)  TempSrc:  Oral    SpO2:  97% 97% 96%  Weight:      Height:        Intake/Output Summary (Last 24 hours) at 05/22/2024 1354 Last data filed at 05/22/2024 1100 Gross per 24 hour  Intake 2329.14 ml  Output --  Net 2329.14 ml   Filed Weights   05/21/24 2235  Weight: 91 kg   Examination: Physical Exam:  Constitutional: WN/WD obese older appearing than her stated age Caucasian female who appears little uncomfortable Respiratory: Diminished to auscultation bilaterally, no wheezing, rales, rhonchi or crackles. Normal respiratory effort and patient is not tachypenic. No accessory muscle use.  Unlabored breathing Cardiovascular: RRR, no murmurs / rubs / gallops. S1 and S2 auscultated. No extremity edema.  Abdomen: Soft, non-tender, distended secondary habitus. Bowel sounds positive.  GU: Deferred. Musculoskeletal: No clubbing / cyanosis of digits/nails. No joint deformity upper and lower extremities.  Skin: No rashes, lesions, ulcers on limited skin evaluation. No  induration; Warm and dry.  Neurologic: CN 2-12 grossly intact with no focal deficits. Romberg sign and cerebellar reflexes not assessed.  Psychiatric: Normal judgment and insight. Alert and oriented x 3. Normal mood and appropriate affect.   Data Reviewed: I have personally reviewed following labs and imaging studies  CBC: Recent Labs  Lab 05/21/24 1530 05/22/24 0127  WBC 10.1 14.3*  NEUTROABS 8.0* 12.5*  HGB 15.5* 13.5  HCT 43.7 39.0  MCV 96.9 98.7  PLT 163 130*   Basic Metabolic Panel: Recent Labs  Lab 05/21/24 1530 05/22/24 0127 05/22/24 0225  NA 136  --  137  K 3.5  --  3.1*  CL 101  --  105  CO2 20*  --  21*  GLUCOSE 98  --  94  BUN 11  --  12  CREATININE 0.99 1.08* 1.02*  CALCIUM  9.5  --  8.4*  MG  --   --  1.6*  PHOS  --   --  3.0   GFR: Estimated Creatinine Clearance: 67 mL/min (A) (by C-G formula based on SCr of 1.02 mg/dL (H)). Liver Function  Tests: Recent Labs  Lab 05/21/24 1530 05/22/24 0225  AST 21 20  ALT 13 9  ALKPHOS 81 66  BILITOT 0.6 0.5  PROT 8.0 6.3*  ALBUMIN 4.3 3.3*   No results for input(s): LIPASE, AMYLASE in the last 168 hours. No results for input(s): AMMONIA in the last 168 hours. Coagulation Profile: No results for input(s): INR, PROTIME in the last 168 hours. Cardiac Enzymes: No results for input(s): CKTOTAL, CKMB, CKMBINDEX, TROPONINI in the last 168 hours. BNP (last 3 results) No results for input(s): PROBNP in the last 8760 hours. HbA1C: No results for input(s): HGBA1C in the last 72 hours. CBG: No results for input(s): GLUCAP in the last 168 hours. Lipid Profile: No results for input(s): CHOL, HDL, LDLCALC, TRIG, CHOLHDL, LDLDIRECT in the last 72 hours. Thyroid Function Tests: No results for input(s): TSH, T4TOTAL, FREET4, T3FREE, THYROIDAB in the last 72 hours. Anemia Panel: No results for input(s): VITAMINB12, FOLATE, FERRITIN, TIBC, IRON, RETICCTPCT in the  last 72 hours. Sepsis Labs: Recent Labs  Lab 05/21/24 1530  LATICACIDVEN 1.9    Recent Results (from the past 240 hours)  Culture, blood (Routine X 2) w Reflex to ID Panel     Status: None (Preliminary result)   Collection Time: 05/22/24  1:40 AM   Specimen: BLOOD RIGHT FOREARM  Result Value Ref Range Status   Specimen Description   Final    BLOOD RIGHT FOREARM Performed at [facility], [address], [place], [place] [address]    Special Requests   Final    BOTTLES DRAWN AEROBIC ONLY Blood Culture results may not be optimal due to an inadequate volume of blood received in culture bottles Performed at [facility], [address], [place], [place] [address]    Culture PENDING  Incomplete   Report Status PENDING  Incomplete    Radiology Studies: CT ABDOMEN PELVIS W CONTRAST Result Date: 05/21/2024 EXAM: CT ABDOMEN AND PELVIS WITH CONTRAST 05/21/2024 05:05:34 PM TECHNIQUE: CT of the abdomen and pelvis was performed with the administration of 100 mL of iohexol  (OMNIPAQUE ) 300 MG/ML solution. Multiplanar reformatted images are provided for review. Automated exposure control, iterative reconstruction, and/or weight-based adjustment of the mA/kV was utilized to reduce the radiation dose to as low as reasonably achievable. COMPARISON: 07/16/2018 CLINICAL HISTORY: Bilateral flank pain, febrile, history of kidney stones, generalized abdominal pain. FINDINGS: LOWER CHEST: No acute abnormality. LIVER: The liver is unremarkable. GALLBLADDER AND BILE DUCTS: The gallbladder has been surgically removed. No biliary ductal dilatation. SPLEEN: No acute abnormality. PANCREAS: No acute abnormality. ADRENAL GLANDS: No acute abnormality. 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 Bilateral simple renal cysts are noted. Per consensus, no follow-up is needed for simple Bosniak type 1 and 2 renal cysts, unless the patient has a malignancy history or risk  factors. The right ureter demonstrates diffuse wall enhancement, which may represent an underlying UTI. No obstructing stone is seen. No hydronephrosis. No perinephric or periureteral stranding. The bladder is within normal limits. GI AND BOWEL: Scattered diverticular changes of the colon are noted without evidence of diverticulitis. The appendix is within normal limits. Small bowel and stomach are unremarkable. There is no bowel obstruction. PERITONEUM AND RETROPERITONEUM: No ascites. No free air. VASCULATURE: Aortic calcifications are seen without aneurysmal dilatation. LYMPH NODES: No lymphadenopathy. REPRODUCTIVE ORGANS: The uterus is within normal limits. No adnexal mass is seen. BONES AND SOFT TISSUES: No acute osseous abnormality. No focal soft tissue abnormality. IMPRESSION: 1. Diffuse right  ureteral wall enhancement, which may represent urinary tract infection, without obstructing stone. 2. 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 3. Diverticulosis without diverticulitis. Electronically signed by: Oneil Devonshire MD 05/21/2024 05:20 PM EST RP Workstation: [id number]   DG Chest 2 View Result Date: 05/21/2024 EXAM: 2 VIEW(S) XRAY OF THE CHEST 05/21/2024 03:37:00 PM COMPARISON: Chest CT 10/04/2023 and chest radiograph 02/15/2019. CLINICAL HISTORY: Infection. ICD10: [id number] Toxic effect of unspecified substance, accidental (unintentional), initial encounter. FINDINGS: LUNGS AND PLEURA: Lungs are clear. No pleural effusion. No pneumothorax. HEART AND MEDIASTINUM: Heart size and pulmonary vascularity are normal. Mediastinal contours appear intact. Tortuous aorta. BONES AND SOFT TISSUES: No acute osseous abnormality. IMPRESSION: 1. No acute cardiopulmonary abnormality. Electronically signed by: Elsie Gravely MD 05/21/2024 03:43 PM EST RP Workstation: [id number]   Scheduled Meds:  aspirin  EC  81 mg Oral Daily   atorvastatin   40 mg Oral Daily   carvedilol   3.125 mg Oral BID PC   enoxaparin   (LOVENOX ) injection  40 mg Subcutaneous Q24H   lamoTRIgine   200 mg Oral Daily   pantoprazole   40 mg Oral q morning   potassium chloride   40 mEq Oral BID   sertraline   100 mg Oral QHS   Continuous Infusions:  cefTRIAXone  (ROCEPHIN )  IV     lactated ringers  75 mL/hr at 05/22/24 0144    LOS: 0 days   Alejandro Marker, DO [facility] Available via Epic secure chat 7am-7pm After these hours, please refer to coverage provider listed on [email or web address] 05/22/2024, 1:54 PM  "

## 2024-05-23 ENCOUNTER — Other Ambulatory Visit (HOSPITAL_COMMUNITY): Payer: Self-pay

## 2024-05-23 DIAGNOSIS — N12 Tubulo-interstitial nephritis, not specified as acute or chronic: Secondary | ICD-10-CM | POA: Diagnosis not present

## 2024-05-23 DIAGNOSIS — A419 Sepsis, unspecified organism: Secondary | ICD-10-CM | POA: Diagnosis not present

## 2024-05-23 LAB — COMPREHENSIVE METABOLIC PANEL WITH GFR
ALT: 13 U/L (ref 0–44)
AST: 26 U/L (ref 15–41)
Albumin: 3.4 g/dL — ABNORMAL LOW (ref 3.5–5.0)
Alkaline Phosphatase: 71 U/L (ref 38–126)
Anion gap: 9 (ref 5–15)
BUN: 16 mg/dL (ref 6–20)
CO2: 24 mmol/L (ref 22–32)
Calcium: 8.9 mg/dL (ref 8.9–10.3)
Chloride: 108 mmol/L (ref 98–111)
Creatinine, Ser: 0.84 mg/dL (ref 0.44–1.00)
GFR, Estimated: >60 mL/min
Glucose, Bld: 69 mg/dL — ABNORMAL LOW (ref 70–99)
Potassium: 4.6 mmol/L (ref 3.5–5.1)
Sodium: 141 mmol/L (ref 135–145)
Total Bilirubin: 0.3 mg/dL (ref 0.0–1.2)
Total Protein: 6.6 g/dL (ref 6.5–8.1)

## 2024-05-23 LAB — CBC WITH DIFFERENTIAL/PLATELET
Abs Immature Granulocytes: 0.04 10*3/uL (ref 0.00–0.07)
Basophils Absolute: 0 10*3/uL (ref 0.0–0.1)
Basophils Relative: 0 %
Eosinophils Absolute: 0 10*3/uL (ref 0.0–0.5)
Eosinophils Relative: 0 %
HCT: 37.9 % (ref 36.0–46.0)
Hemoglobin: 12.4 g/dL (ref 12.0–15.0)
Immature Granulocytes: 0 %
Lymphocytes Relative: 21 %
Lymphs Abs: 1.9 10*3/uL (ref 0.7–4.0)
MCH: 33.4 pg (ref 26.0–34.0)
MCHC: 32.7 g/dL (ref 30.0–36.0)
MCV: 102.2 fL — ABNORMAL HIGH (ref 80.0–100.0)
Monocytes Absolute: 0.7 10*3/uL (ref 0.1–1.0)
Monocytes Relative: 8 %
Neutro Abs: 6.2 10*3/uL (ref 1.7–7.7)
Neutrophils Relative %: 71 %
Platelets: 150 10*3/uL (ref 150–400)
RBC: 3.71 MIL/uL — ABNORMAL LOW (ref 3.87–5.11)
RDW: 13.1 % (ref 11.5–15.5)
WBC: 8.9 10*3/uL (ref 4.0–10.5)
nRBC: 0 % (ref 0.0–0.2)

## 2024-05-23 LAB — URINE CULTURE: Culture: >=100000 — AB

## 2024-05-23 LAB — PHOSPHORUS: Phosphorus: 2.5 mg/dL (ref 2.5–4.6)

## 2024-05-23 LAB — PROCALCITONIN: Procalcitonin: 5.43 ng/mL

## 2024-05-23 LAB — MAGNESIUM: Magnesium: 2.4 mg/dL (ref 1.7–2.4)

## 2024-05-23 MED ORDER — SULFAMETHOXAZOLE-TRIMETHOPRIM 800-160 MG PO TABS
1.0000 | ORAL_TABLET | Freq: Two times a day (BID) | ORAL | 0 refills | Status: AC
  Filled 2024-05-23: qty 24, 12d supply, fill #0

## 2024-05-23 NOTE — Discharge Summary (Signed)
 " Physician Discharge Summary   Patient: Heather Gomez MRN: [id number] DOB: [birth year]  Admit date:     05/21/2024  Discharge date: 05/23/24  Discharge Physician: Toribio Door   PCP: Frederik Charleston, MD   Recommendations at discharge:  Resolution of pyelonephritis  Discharge Diagnoses: Principal Problem:   Sepsis (HCC) Right pyelonephritis Anion gap metabolic acidosis Thrombocytopenia Class 1 obesity Emphysema Pulmonary nodules Essential hypertension Hyperlipidemia Anxiety/depression Migraines GERD  Hospital Course: 60 year old woman PMH including high blood pressure, presented with bilateral flank pain, urinary urgency, fever.  Admitted for sepsis secondary to pyelonephritis.  Consultants None   Procedures/Events None   Sepsis Right pyelonephritis Anion gap metabolic acidosis Presented with fever, tachycardia, tachypnea, urinary source Treated empirically with ceftriaxone  Afebrile greater than 24 hours, WBC within normal limits today, thrombocytopenia resolved Discharge on oral antibiotics.  Thrombocytopenia Resolved   Hypokalemia Hypomagnesemia Resolved.  Cigarette smoker Recommend cessation  Class 1 obesity Body mass index is 33.38 kg/m.  Emphysema Pulmonary nodules  Essential hypertension Hyperlipidemia Anxiety/depression Migraines GERD Stable.  Continue carvedilol , atorvastatin , Zoloft , Lamictal , Flexeril    Disposition: Home Diet recommendation:  Regular diet DISCHARGE MEDICATION: Allergies as of 05/23/2024       Reactions   Labetalol Nausea And Vomiting   Percocet [oxycodone -acetaminophen ] Nausea And Vomiting   Propranolol Other (See Comments)   gives me migraines        Medication List     STOP taking these medications    fluorouracil  5 % cream Commonly known as: Efudex    HYDROcodone -acetaminophen  5-325 MG tablet Commonly known as: NORCO/VICODIN   ibuprofen  600 MG tablet Commonly known as: ADVIL         TAKE these medications    aspirin  EC 81 MG tablet Take 1 tablet (81 mg total) by mouth daily.   atorvastatin  40 MG tablet Commonly known as: LIPITOR Take 1 tablet (40 mg total) by mouth daily.   carvedilol  25 MG tablet Commonly known as: COREG  Take 1 tablet (25 mg total) by mouth 2 (two) times daily after a meal.   cyclobenzaprine  10 MG tablet Commonly known as: FLEXERIL  Take 10 mg by mouth 3 (three) times daily as needed for muscle spasms.   Emgality  120 MG/ML Soaj Generic drug: Galcanezumab -gnlm Inject 120 mg into the skin every 30 (thirty) days for migraine prevention.   lamoTRIgine  100 MG tablet Commonly known as: LaMICtal  Take 2 tablets (200 mg total) by mouth daily.   lisinopril -hydrochlorothiazide  10-12.5 MG tablet Commonly known as: ZESTORETIC  Take 2 tablets by mouth daily.   pantoprazole  40 MG tablet Commonly known as: PROTONIX  Take 1 tablet (40 mg total) by mouth every morning.   promethazine  25 MG tablet Commonly known as: PHENERGAN  Take 1 tablet (25 mg total) by mouth every 8 (eight) hours as needed for nausea   sertraline  100 MG tablet Commonly known as: ZOLOFT  Take 1 tablet (100 mg total) by mouth daily. What changed: Another medication with the same name was removed. Continue taking this medication, and follow the directions you see here.   sulfamethoxazole -trimethoprim  800-160 MG tablet Commonly known as: BACTRIM  DS Take 1 tablet by mouth 2 (two) times daily for 12 days.        Follow-up Information     Frederik Charleston, MD. Schedule an appointment as soon as possible for a visit in 1 week(s).   Specialty: Family Medicine Contact information: [address] [place] [address] [phone]  Discharge Exam: Filed Weights   05/21/24 2235  Weight: 91 kg   Physical Exam Vitals reviewed.  Constitutional:      General: She is not in acute distress.    Appearance: She is not ill-appearing or  toxic-appearing.  Cardiovascular:     Rate and Rhythm: Normal rate and regular rhythm.     Heart sounds: No murmur heard. Pulmonary:     Effort: Pulmonary effort is normal. No respiratory distress.     Breath sounds: No wheezing, rhonchi or rales.  Neurological:     Mental Status: She is alert.  Psychiatric:        Mood and Affect: Mood normal.        Behavior: Behavior normal.     Complete metabolic panel showed glucose of 69, otherwise unremarkable.  Potassium within normal limits.  Magnesium  within normal limits. Procalcitonin elevated, significance unclear as this is used only for lower respiratory tract infections Platelet count has recovered 150 Urinalysis abnormal, urine culture showed greater than 100,000 E. coli resistant to ampicillin and Unasyn.  Sensitive to cephalosporins, Cipro , Bactrim .  Condition at discharge: good  The results of significant diagnostics from this hospitalization (including imaging, microbiology, ancillary and laboratory) are listed below for reference.   Imaging Studies: CT ABDOMEN PELVIS W CONTRAST Result Date: 05/21/2024 EXAM: CT ABDOMEN AND PELVIS WITH CONTRAST 05/21/2024 05:05:34 PM TECHNIQUE: CT of the abdomen and pelvis was performed with the administration of 100 mL of iohexol  (OMNIPAQUE ) 300 MG/ML solution. Multiplanar reformatted images are provided for review. Automated exposure control, iterative reconstruction, and/or weight-based adjustment of the mA/kV was utilized to reduce the radiation dose to as low as reasonably achievable. COMPARISON: 07/16/2018 CLINICAL HISTORY: Bilateral flank pain, febrile, history of kidney stones, generalized abdominal pain. FINDINGS: LOWER CHEST: No acute abnormality. LIVER: The liver is unremarkable. GALLBLADDER AND BILE DUCTS: The gallbladder has been surgically removed. No biliary ductal dilatation. SPLEEN: No acute abnormality. PANCREAS: No acute abnormality. ADRENAL GLANDS: No acute abnormality. 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 Bilateral simple renal cysts are noted. Per consensus, no follow-up is needed for simple Bosniak type 1 and 2 renal cysts, unless the patient has a malignancy history or risk factors. The right ureter demonstrates diffuse wall enhancement, which may represent an underlying UTI. No obstructing stone is seen. No hydronephrosis. No perinephric or periureteral stranding. The bladder is within normal limits. GI AND BOWEL: Scattered diverticular changes of the colon are noted without evidence of diverticulitis. The appendix is within normal limits. Small bowel and stomach are unremarkable. There is no bowel obstruction. PERITONEUM AND RETROPERITONEUM: No ascites. No free air. VASCULATURE: Aortic calcifications are seen without aneurysmal dilatation. LYMPH NODES: No lymphadenopathy. REPRODUCTIVE ORGANS: The uterus is within normal limits. No adnexal mass is seen. BONES AND SOFT TISSUES: No acute osseous abnormality. No focal soft tissue abnormality. IMPRESSION: 1. Diffuse right ureteral wall enhancement, which may represent urinary tract infection, without obstructing stone. 2. 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 3. Diverticulosis without diverticulitis. Electronically signed by: Oneil Devonshire MD 05/21/2024 05:20 PM EST RP Workstation: [id number]   DG Chest 2 View Result Date: 05/21/2024 EXAM: 2 VIEW(S) XRAY OF THE CHEST 05/21/2024 03:37:00 PM COMPARISON: Chest CT 10/04/2023 and chest radiograph 02/15/2019. CLINICAL HISTORY: Infection. ICD10: [id number] Toxic effect of unspecified substance, accidental (unintentional), initial encounter. FINDINGS: LUNGS AND PLEURA: Lungs are clear. No pleural effusion. No pneumothorax. HEART AND MEDIASTINUM: Heart size and pulmonary vascularity are normal. Mediastinal contours appear intact. Tortuous aorta. BONES AND  SOFT TISSUES: No acute osseous abnormality. IMPRESSION: 1. No acute cardiopulmonary abnormality.  Electronically signed by: Elsie Gravely MD 05/21/2024 03:43 PM EST RP Workstation: [id number]    Microbiology: Results for orders placed or performed during the hospital encounter of 05/21/24  Urine Culture     Status: Abnormal   Collection Time: 05/21/24  5:59 PM   Specimen: Urine, Clean Catch  Result Value Ref Range Status   Specimen Description   Final    URINE, CLEAN CATCH Performed at [facility], [address] [place], [place] [address]    Special Requests   Final    NONE Performed at [facility], [address] [place], [place] [address]    Culture >=100,000 COLONIES/mL ESCHERICHIA COLI (A)  Final   Report Status 05/23/2024 FINAL  Final   Organism ID, Bacteria ESCHERICHIA COLI (A)  Final      Susceptibility   Escherichia coli - MIC*    AMPICILLIN >=32 RESISTANT Resistant     CEFAZOLIN  (URINE) Value in next row Sensitive      4 SENSITIVEThis is a modified FDA-approved test that has been validated and its performance characteristics determined by the reporting laboratory.  This laboratory is certified under the Clinical Laboratory Improvement Amendments CLIA as qualified to perform high complexity clinical laboratory testing.    CEFEPIME Value in next row Sensitive      4 SENSITIVEThis is a modified FDA-approved test that has been validated and its performance characteristics determined by the reporting laboratory.  This laboratory is certified under the Clinical Laboratory Improvement Amendments CLIA as qualified to perform high complexity clinical laboratory testing.    ERTAPENEM Value in next row Sensitive      4 SENSITIVEThis is a modified FDA-approved test that has been validated and its performance characteristics determined by the reporting laboratory.  This laboratory is certified under the Clinical Laboratory Improvement Amendments CLIA as qualified to perform high complexity clinical laboratory testing.    CEFTRIAXONE  Value in next row  Sensitive      4 SENSITIVEThis is a modified FDA-approved test that has been validated and its performance characteristics determined by the reporting laboratory.  This laboratory is certified under the Clinical Laboratory Improvement Amendments CLIA as qualified to perform high complexity clinical laboratory testing.    CIPROFLOXACIN  Value in next row Sensitive      4 SENSITIVEThis is a modified FDA-approved test that has been validated and its performance characteristics determined by the reporting laboratory.  This laboratory is certified under the Clinical Laboratory Improvement Amendments CLIA as qualified to perform high complexity clinical laboratory testing.    GENTAMICIN Value in next row Sensitive      4 SENSITIVEThis is a modified FDA-approved test that has been validated and its performance characteristics determined by the reporting laboratory.  This laboratory is certified under the Clinical Laboratory Improvement Amendments CLIA as qualified to perform high complexity clinical laboratory testing.    NITROFURANTOIN  Value in next row Sensitive      4 SENSITIVEThis is a modified FDA-approved test that has been validated and its performance characteristics determined by the reporting laboratory.  This laboratory is certified under the Clinical Laboratory Improvement Amendments CLIA as qualified to perform high complexity clinical laboratory testing.    TRIMETH /SULFA  Value in next row Sensitive      4 SENSITIVEThis is a modified FDA-approved test that has been validated and its performance characteristics determined by the reporting laboratory.  This laboratory is certified under  the Clinical Laboratory Improvement Amendments CLIA as qualified to perform high complexity clinical laboratory testing.    AMPICILLIN/SULBACTAM Value in next row Intermediate      4 SENSITIVEThis is a modified FDA-approved test that has been validated and its performance characteristics determined by the reporting  laboratory.  This laboratory is certified under the Clinical Laboratory Improvement Amendments CLIA as qualified to perform high complexity clinical laboratory testing.    PIP/TAZO Value in next row Sensitive      <=4 SENSITIVEThis is a modified FDA-approved test that has been validated and its performance characteristics determined by the reporting laboratory.  This laboratory is certified under the Clinical Laboratory Improvement Amendments CLIA as qualified to perform high complexity clinical laboratory testing.    MEROPENEM Value in next row Sensitive      <=4 SENSITIVEThis is a modified FDA-approved test that has been validated and its performance characteristics determined by the reporting laboratory.  This laboratory is certified under the Clinical Laboratory Improvement Amendments CLIA as qualified to perform high complexity clinical laboratory testing.    * >=100,000 COLONIES/mL ESCHERICHIA COLI  Culture, blood (Routine X 2) w Reflex to ID Panel     Status: None (Preliminary result)   Collection Time: 05/22/24  1:40 AM   Specimen: BLOOD RIGHT FOREARM  Result Value Ref Range Status   Specimen Description   Final    BLOOD RIGHT FOREARM Performed at [facility], [address] [place], [place] [address]    Special Requests   Final    BOTTLES DRAWN AEROBIC ONLY Blood Culture results may not be optimal due to an inadequate volume of blood received in culture bottles Performed at [facility], [address], [place], [place] [address]    Culture   Final    NO GROWTH 1 DAY Performed at [facility], [address] [place], [place] [address]    Report Status PENDING  Incomplete  Culture, blood (Routine X 2) w Reflex to ID Panel     Status: None (Preliminary result)   Collection Time: 05/22/24  1:40 AM   Specimen: BLOOD  Result Value Ref Range Status   Specimen Description   Final    BLOOD RIGHT ANTECUBITAL Performed at [facility],  [address], [place], [place] [address]    Special Requests   Final    BOTTLES DRAWN AEROBIC ONLY Blood Culture results may not be optimal due to an inadequate volume of blood received in culture bottles Performed at [facility], [address] [place], [place] [address]    Culture   Final    NO GROWTH 1 DAY Performed at [facility], [address] [place], [place] [address]    Report Status PENDING  Incomplete    Labs: CBC: Recent Labs  Lab 05/21/24 1530 05/22/24 0127 05/23/24 0402  WBC 10.1 14.3* 8.9  NEUTROABS 8.0* 12.5* 6.2  HGB 15.5* 13.5 12.4  HCT 43.7 39.0 37.9  MCV 96.9 98.7 102.2*  PLT 163 130* 150   Basic Metabolic Panel: Recent Labs  Lab 05/21/24 1530 05/22/24 0127 05/22/24 0225 05/23/24 0402  NA 136  --  137 141  K 3.5  --  3.1* 4.6  CL 101  --  105 108  CO2 20*  --  21* 24  GLUCOSE 98  --  94 69*  BUN 11  --  12 16  CREATININE 0.99 1.08* 1.02* 0.84  CALCIUM  9.5  --  8.4* 8.9  MG  --   --  1.6* 2.4  PHOS  --   --  3.0 2.5   Liver Function Tests: Recent Labs  Lab 05/21/24 1530 05/22/24 0225 05/23/24 0402  AST 21 20 26   ALT 13 9 13   ALKPHOS 81 66 71  BILITOT 0.6 0.5 0.3  PROT 8.0 6.3* 6.6  ALBUMIN 4.3 3.3* 3.4*   CBG: No results for input(s): GLUCAP in the last 168 hours.  Discharge time spent: greater than 30 minutes.  Signed: Toribio Door, MD [facility] 05/23/2024 "

## 2024-05-23 NOTE — Progress Notes (Signed)
 Discharge medications delivered to patient at the bedside in a secure bag.

## 2024-05-23 NOTE — Progress Notes (Addendum)
 Discharge instructions reviewed with patient, verbalized understanding. All questions answered. All belongings accounted for. Patient to follow up with MD in  1-2 weeks. PIV removed.

## 2024-05-25 ENCOUNTER — Other Ambulatory Visit (HOSPITAL_BASED_OUTPATIENT_CLINIC_OR_DEPARTMENT_OTHER): Payer: Self-pay

## 2024-05-25 ENCOUNTER — Other Ambulatory Visit: Payer: Self-pay

## 2024-05-25 MED ORDER — CIPROFLOXACIN HCL 250 MG PO TABS
250.0000 mg | ORAL_TABLET | Freq: Two times a day (BID) | ORAL | 0 refills | Status: AC
  Filled 2024-05-25: qty 20, 10d supply, fill #0

## 2024-05-27 LAB — CULTURE, BLOOD (ROUTINE X 2)
Culture: NO GROWTH
Culture: NO GROWTH

## 2024-05-27 LAB — MISC LABCORP TEST (SEND OUT): Labcorp test code: 83935

## 2024-06-01 ENCOUNTER — Other Ambulatory Visit (HOSPITAL_BASED_OUTPATIENT_CLINIC_OR_DEPARTMENT_OTHER): Payer: Self-pay
